# Patient Record
Sex: Female | Born: 1950 | Race: White | Hispanic: No | Smoking: Never smoker
Health system: Southern US, Community
[De-identification: ages and names within clinical notes are randomized; demographics above are authoritative.]

## PROBLEM LIST (undated history)

## (undated) DIAGNOSIS — I10 Essential (primary) hypertension: Secondary | ICD-10-CM

## (undated) DIAGNOSIS — D649 Anemia, unspecified: Secondary | ICD-10-CM

## (undated) DIAGNOSIS — F419 Anxiety disorder, unspecified: Secondary | ICD-10-CM

## (undated) DIAGNOSIS — E785 Hyperlipidemia, unspecified: Secondary | ICD-10-CM

## (undated) HISTORY — PX: ABDOMINAL HYSTERECTOMY: SHX81

---

## 2011-09-02 ENCOUNTER — Ambulatory Visit: Payer: Self-pay | Admitting: Internal Medicine

## 2017-10-10 ENCOUNTER — Encounter: Payer: Self-pay | Admitting: Emergency Medicine

## 2017-10-10 ENCOUNTER — Ambulatory Visit (INDEPENDENT_AMBULATORY_CARE_PROVIDER_SITE_OTHER): Payer: Medicare Other

## 2017-10-10 ENCOUNTER — Other Ambulatory Visit: Payer: Self-pay

## 2017-10-10 ENCOUNTER — Ambulatory Visit
Admission: EM | Admit: 2017-10-10 | Discharge: 2017-10-10 | Disposition: A | Discharge status: Home / Self Care | Payer: Medicare Other | Attending: Family Medicine | Admitting: Family Medicine

## 2017-10-10 DIAGNOSIS — S52502A Unspecified fracture of the lower end of left radius, initial encounter for closed fracture: Secondary | ICD-10-CM | POA: Diagnosis not present

## 2017-10-10 DIAGNOSIS — M25532 Pain in left wrist: Secondary | ICD-10-CM | POA: Diagnosis not present

## 2017-10-10 DIAGNOSIS — W19XXXA Unspecified fall, initial encounter: Secondary | ICD-10-CM

## 2017-10-10 HISTORY — DX: Anemia, unspecified: D64.9

## 2017-10-10 HISTORY — DX: Hyperlipidemia, unspecified: E78.5

## 2017-10-10 HISTORY — DX: Anxiety disorder, unspecified: F41.9

## 2017-10-10 HISTORY — DX: Essential (primary) hypertension: I10

## 2017-10-10 NOTE — ED Provider Notes (Signed)
[facility] ____________________________________________  Time seen: Approximately 0939 AM  I have reviewed the triage vital signs and the nursing notes.   HISTORY  Chief Complaint Wrist Injury (DOI 10/10/17)   HPI Kimberly Hendrix is a 67 y.o. female presented for evaluation of left wrist pain post injury that occurred earlier this morning prior to arrival.  Patient reports that her and her cat got outside, and in the process of getting the cat she tripped over some briers and fell.  States that she tried to catch herself with her left arm.  Denies other pain or injuries.  No head injury or loss conscious.  States continued pain to left wrist.  States did take over-the-counter ibuprofen just prior to arrival and states that that has helped with pain.  Denies paresthesias, loss of sensation, pain radiation or other discomfort.  States pain to left wrist currently is mild, worse with direct palpation or movement.  States range of motion limited to left wrist. Denies recent sickness. Denies recent antibiotic use.     Past Medical History:  Diagnosis Date  . Anemia   . Anxiety   . Hyperlipidemia   . Hypertension     There are no active problems to display for this patient.   Past Surgical History:  Procedure Laterality Date  . ABDOMINAL HYSTERECTOMY       No current facility-administered medications for this encounter.   Current Outpatient Medications:  .  amLODipine (NORVASC) 5 MG tablet, Take 5 mg by mouth daily., Disp: , Rfl:  .  aspirin EC 81 MG tablet, Take 81 mg by mouth daily., Disp: , Rfl:  .  atorvastatin (LIPITOR) 10 MG tablet, Take 10 mg by mouth daily., Disp: , Rfl:  .  benazepril-hydrochlorthiazide (LOTENSIN HCT) 20-12.5 MG tablet, Take 2 tablets by mouth daily., Disp: , Rfl:  .  buPROPion (WELLBUTRIN XL) 150 MG 24 hr tablet, Take 150 mg by mouth daily., Disp: , Rfl:  .  Cholecalciferol (VITAMIN D3) 2000 units TABS, Take 1 tablet by mouth daily., Disp: ,  Rfl:  .  diphenhydrAMINE (BENADRYL) 25 mg capsule, Take 25 mg by mouth at bedtime as needed., Disp: , Rfl:  .  ferrous sulfate 325 (65 FE) MG tablet, Take 325 mg by mouth daily with breakfast., Disp: , Rfl:  .  FLUoxetine (PROZAC) 20 MG tablet, Take 20 mg by mouth daily., Disp: , Rfl:  .  fluticasone (FLONASE) 50 MCG/ACT nasal spray, Place 1 spray into both nostrils daily., Disp: , Rfl:  .  ibuprofen (ADVIL,MOTRIN) 200 MG tablet, Take 800 mg by mouth daily., Disp: , Rfl:  .  metoprolol succinate (TOPROL-XL) 50 MG 24 hr tablet, Take 50 mg by mouth daily. Take with or immediately following a meal., Disp: , Rfl:  .  Multiple Vitamins-Minerals (CENTRUM SILVER 50+WOMEN PO), Take 1 tablet by mouth daily., Disp: , Rfl:  .  omeprazole (PRILOSEC) 40 MG capsule, Take 40 mg by mouth daily., Disp: , Rfl:   Allergies Penicillins  Family History  Problem Relation Age of Onset  . Breast cancer Mother 24       expired at age 46  . Hypertension Father   . Heart attack Father   . Diabetes Father     Social History Social History   Tobacco Use  . Smoking status: Never Smoker  . Smokeless tobacco: Never Used  Substance Use Topics  . Alcohol use: Yes    Alcohol/week: 1.2 oz    Types: 2 Glasses  of wine per week  . Drug use: No    Review of Systems Constitutional: No fever/chills Cardiovascular: Denies chest pain. Respiratory: Denies shortness of breath. Gastrointestinal: No abdominal pain. Musculoskeletal: Negative for back pain. As above.  Skin: Negative for rash.   ____________________________________________   PHYSICAL EXAM:  VITAL SIGNS: ED Triage Vitals  Enc Vitals Group     BP 10/10/17 0845 (!) 160/56     Pulse Rate 10/10/17 0845 60     Resp 10/10/17 0845 16     Temp 10/10/17 0845 98 F (36.7 C)     Temp Source 10/10/17 0845 Oral     SpO2 10/10/17 0845 98 %     Weight 10/10/17 0845 150 lb (68 kg)     Height 10/10/17 0845 5\' 6"  (1.676 m)     Head Circumference --       Peak Flow --      Pain Score 10/10/17 0846 7     Pain Loc --      Pain Edu? --      Excl. in GC? --     Constitutional: Alert and oriented. Well appearing and in no acute distress. ENT      Head: Normocephalic and atraumatic. Cardiovascular: Normal rate, regular rhythm. Grossly normal heart sounds.  Good peripheral circulation. Respiratory: Normal respiratory effort without tachypnea nor retractions. Breath sounds are clear and equal bilaterally. No wheezes, rales, rhonchi.. Musculoskeletal: Steady gait. Bilateral distal radial pulses equal and easily palpated.  Except: left distal radius moderate tenderness to palpation, positive deformity noted, mild localized swelling, no ecchymosis, wrist rotation limited, normal distal sensation and capillary refill to left hand, and left hand no motor or tendon deficits noted, left upper extremity otherwise nontender. Neurologic:  Normal speech and language. No gross focal neurologic deficits are appreciated. Speech is normal. No gait instability.  Skin:  Skin is warm, dry and intact. No rash noted. Psychiatric: Mood and affect are normal. Speech and behavior are normal. Patient exhibits appropriate insight and judgment   ___________________________________________   LABS (all labs ordered are listed, but only abnormal results are displayed)  Labs Reviewed - No data to display ____________________________________________  RADIOLOGY  Dg Wrist Complete Left  Result Date: 10/10/2017 CLINICAL DATA:  Larey SeatFell today.  Pain and deformity. EXAM: LEFT WRIST - COMPLETE 3+ VIEW COMPARISON:  None. FINDINGS: Transverse fracture of the distal radial metaphysis with dorsal displacement 4 mm and slight dorsal tilt of the distal radial articular surface. Fracture line does extend to the articular surface. Carpal bones move with the distal radial articular surface. No ulnar fracture or carpal fracture seen. IMPRESSION: Distal radial metaphyseal fracture with extension  to the joint surface. 4 mm dorsal displacement and slight dorsal tilt. Electronically Signed   By: Paulina FusiMark  Shogry M.D.   On: 10/10/2017 09:21   ____________________________________________   PROCEDURES Procedures    INITIAL IMPRESSION / ASSESSMENT AND PLAN / ED COURSE  Pertinent labs & imaging results that were available during my care of the patient were reviewed by me and considered in my medical decision making (see chart for details).  Well-appearing patient.  No acute distress.  Presented for evaluation of left wrist pain post mechanical injury that occurred prior to arrival.  Left wrist x-ray as above per radiologist, reviewed by myself.  Left x-ray distal radial modifiable fracture with extension to the joint surface and dorsal displacement and dorsal tilt noted.  OCL volar dorsal splint applied, sling given.  Directed to ice, elevate and  rest.  Patient denies need for prescription pain medication.  Recommend close orthopedic follow-up as concern may need surgical correction versus reduction.  Instructed to call today and have follow-up in 2-3 days.  Patient verbalized understanding, orthopedic information given.  Discussed follow up with Primary care physician this week as needed. Discussed follow up and return parameters including no resolution or any worsening concerns. Patient verbalized understanding and agreed to plan.   ____________________________________________   FINAL CLINICAL IMPRESSION(S) / ED DIAGNOSES  Final diagnoses:  Displaced fracture of distal end of left radius     ED Discharge Orders    None       Note: This dictation was prepared with Dragon dictation along with smaller phrase technology. Any transcriptional errors that result from this process are unintentional.         Renford Dills, NP 10/10/17 1032

## 2017-10-10 NOTE — Discharge Instructions (Signed)
Ice. Elevate. Keep in splint.   Follow up with orthopedic this week as discussed. See above to call today.   Follow up with your primary care physician this week as needed. Return to Urgent care for new or worsening concerns.

## 2017-10-10 NOTE — ED Triage Notes (Signed)
Patient in today c/o left wrist injury when she fell this morning.

## 2017-10-16 ENCOUNTER — Ambulatory Visit: Payer: Medicare Other

## 2017-10-16 ENCOUNTER — Ambulatory Visit: Payer: Medicare Other | Admitting: Anesthesiology

## 2017-10-16 ENCOUNTER — Ambulatory Visit
Admission: RE | Admit: 2017-10-16 | Discharge: 2017-10-16 | Disposition: A | Discharge status: Home / Self Care | Payer: Medicare Other | Source: Ambulatory Visit | Attending: Orthopedic Surgery | Admitting: Orthopedic Surgery

## 2017-10-16 ENCOUNTER — Other Ambulatory Visit: Payer: Self-pay

## 2017-10-16 ENCOUNTER — Encounter
Admission: RE | Disposition: A | Discharge status: Home / Self Care | Payer: Self-pay | Source: Ambulatory Visit | Attending: Orthopedic Surgery

## 2017-10-16 DIAGNOSIS — Z880 Allergy status to penicillin: Secondary | ICD-10-CM | POA: Diagnosis not present

## 2017-10-16 DIAGNOSIS — Y998 Other external cause status: Secondary | ICD-10-CM | POA: Diagnosis not present

## 2017-10-16 DIAGNOSIS — Z8249 Family history of ischemic heart disease and other diseases of the circulatory system: Secondary | ICD-10-CM | POA: Diagnosis not present

## 2017-10-16 DIAGNOSIS — I10 Essential (primary) hypertension: Secondary | ICD-10-CM | POA: Diagnosis not present

## 2017-10-16 DIAGNOSIS — M810 Age-related osteoporosis without current pathological fracture: Secondary | ICD-10-CM | POA: Diagnosis not present

## 2017-10-16 DIAGNOSIS — W010XXA Fall on same level from slipping, tripping and stumbling without subsequent striking against object, initial encounter: Secondary | ICD-10-CM | POA: Insufficient documentation

## 2017-10-16 DIAGNOSIS — S52552A Other extraarticular fracture of lower end of left radius, initial encounter for closed fracture: Secondary | ICD-10-CM | POA: Insufficient documentation

## 2017-10-16 DIAGNOSIS — K219 Gastro-esophageal reflux disease without esophagitis: Secondary | ICD-10-CM | POA: Insufficient documentation

## 2017-10-16 DIAGNOSIS — Z9889 Other specified postprocedural states: Secondary | ICD-10-CM

## 2017-10-16 DIAGNOSIS — F329 Major depressive disorder, single episode, unspecified: Secondary | ICD-10-CM | POA: Insufficient documentation

## 2017-10-16 DIAGNOSIS — Z79899 Other long term (current) drug therapy: Secondary | ICD-10-CM | POA: Diagnosis not present

## 2017-10-16 DIAGNOSIS — Y92009 Unspecified place in unspecified non-institutional (private) residence as the place of occurrence of the external cause: Secondary | ICD-10-CM | POA: Diagnosis not present

## 2017-10-16 DIAGNOSIS — Y93K9 Activity, other involving animal care: Secondary | ICD-10-CM | POA: Insufficient documentation

## 2017-10-16 DIAGNOSIS — Z7982 Long term (current) use of aspirin: Secondary | ICD-10-CM | POA: Insufficient documentation

## 2017-10-16 DIAGNOSIS — Z8781 Personal history of (healed) traumatic fracture: Secondary | ICD-10-CM

## 2017-10-16 HISTORY — PX: OPEN REDUCTION INTERNAL FIXATION (ORIF) DISTAL RADIAL FRACTURE: SHX5989

## 2017-10-16 SURGERY — OPEN REDUCTION INTERNAL FIXATION (ORIF) DISTAL RADIUS FRACTURE
Anesthesia: General | Site: Wrist | Laterality: Left | Wound class: Clean

## 2017-10-16 MED ORDER — FENTANYL CITRATE (PF) 100 MCG/2ML IJ SOLN
INTRAMUSCULAR | Status: DC | PRN
  Administered 2017-10-16 (×2): 50 ug via INTRAVENOUS

## 2017-10-16 MED ORDER — LACTATED RINGERS IV SOLN
INTRAVENOUS | Status: DC
  Administered 2017-10-16: 15:00:00 via INTRAVENOUS

## 2017-10-16 MED ORDER — ONDANSETRON HCL 4 MG/2ML IJ SOLN: 4.0000 mg | Freq: Once | INTRAMUSCULAR | Status: DC | PRN

## 2017-10-16 MED ORDER — ONDANSETRON HCL 4 MG/2ML IJ SOLN: 4.0000 mg | Freq: Four times a day (QID) | INTRAMUSCULAR | Status: DC | PRN

## 2017-10-16 MED ORDER — METOCLOPRAMIDE HCL 10 MG PO TABS: 5.0000 mg | ORAL_TABLET | Freq: Three times a day (TID) | ORAL | Status: DC | PRN

## 2017-10-16 MED ORDER — ONDANSETRON HCL 4 MG/2ML IJ SOLN
INTRAMUSCULAR | Status: AC
  Filled 2017-10-16: qty 2

## 2017-10-16 MED ORDER — DEXAMETHASONE SODIUM PHOSPHATE 10 MG/ML IJ SOLN
INTRAMUSCULAR | Status: DC | PRN
  Administered 2017-10-16: 10 mg via INTRAVENOUS

## 2017-10-16 MED ORDER — CLINDAMYCIN PHOSPHATE 900 MG/50ML IV SOLN
INTRAVENOUS | Status: AC
  Filled 2017-10-16: qty 50

## 2017-10-16 MED ORDER — CLINDAMYCIN PHOSPHATE 900 MG/50ML IV SOLN
900.0000 mg | Freq: Once | INTRAVENOUS | Status: AC
  Administered 2017-10-16: 900 mg via INTRAVENOUS

## 2017-10-16 MED ORDER — PROPOFOL 10 MG/ML IV BOLUS
INTRAVENOUS | Status: AC
  Filled 2017-10-16: qty 20

## 2017-10-16 MED ORDER — HYDROCODONE-ACETAMINOPHEN 5-325 MG PO TABS
ORAL_TABLET | ORAL | Status: AC
  Filled 2017-10-16: qty 1

## 2017-10-16 MED ORDER — PROPOFOL 10 MG/ML IV BOLUS
INTRAVENOUS | Status: DC | PRN
  Administered 2017-10-16: 200 mg via INTRAVENOUS

## 2017-10-16 MED ORDER — ACETAMINOPHEN 10 MG/ML IV SOLN
INTRAVENOUS | Status: AC
  Filled 2017-10-16: qty 100

## 2017-10-16 MED ORDER — HYDROCODONE-ACETAMINOPHEN 5-325 MG PO TABS
1.0000 | ORAL_TABLET | ORAL | Status: DC | PRN
  Administered 2017-10-16: 1 via ORAL

## 2017-10-16 MED ORDER — METOCLOPRAMIDE HCL 5 MG/ML IJ SOLN: 5.0000 mg | Freq: Three times a day (TID) | INTRAMUSCULAR | Status: DC | PRN

## 2017-10-16 MED ORDER — DEXAMETHASONE SODIUM PHOSPHATE 10 MG/ML IJ SOLN
INTRAMUSCULAR | Status: AC
  Filled 2017-10-16: qty 1

## 2017-10-16 MED ORDER — FENTANYL CITRATE (PF) 100 MCG/2ML IJ SOLN: 25.0000 ug | INTRAMUSCULAR | Status: DC | PRN

## 2017-10-16 MED ORDER — NEOMYCIN-POLYMYXIN B GU 40-200000 IR SOLN
Status: DC | PRN
  Administered 2017-10-16: 2 mL

## 2017-10-16 MED ORDER — ONDANSETRON HCL 4 MG/2ML IJ SOLN
INTRAMUSCULAR | Status: DC | PRN
  Administered 2017-10-16: 4 mg via INTRAVENOUS

## 2017-10-16 MED ORDER — SODIUM CHLORIDE 0.9 % IV SOLN: INTRAVENOUS | Status: DC

## 2017-10-16 MED ORDER — ONDANSETRON HCL 4 MG PO TABS: 4.0000 mg | ORAL_TABLET | Freq: Four times a day (QID) | ORAL | Status: DC | PRN

## 2017-10-16 MED ORDER — EPHEDRINE SULFATE 50 MG/ML IJ SOLN
INTRAMUSCULAR | Status: DC | PRN
  Administered 2017-10-16: 15 mg via INTRAVENOUS

## 2017-10-16 MED ORDER — FENTANYL CITRATE (PF) 100 MCG/2ML IJ SOLN
INTRAMUSCULAR | Status: AC
  Filled 2017-10-16: qty 2

## 2017-10-16 MED ORDER — MIDAZOLAM HCL 2 MG/2ML IJ SOLN
INTRAMUSCULAR | Status: AC
  Filled 2017-10-16: qty 2

## 2017-10-16 MED ORDER — MIDAZOLAM HCL 2 MG/2ML IJ SOLN
INTRAMUSCULAR | Status: DC | PRN
  Administered 2017-10-16: 2 mg via INTRAVENOUS

## 2017-10-16 MED ORDER — ACETAMINOPHEN 10 MG/ML IV SOLN
INTRAVENOUS | Status: DC | PRN
  Administered 2017-10-16: 1000 mg via INTRAVENOUS

## 2017-10-16 MED ORDER — NEOMYCIN-POLYMYXIN B GU 40-200000 IR SOLN
Status: AC
  Filled 2017-10-16: qty 2

## 2017-10-16 SURGICAL SUPPLY — 33 items
BANDAGE ACE 4X5 VEL STRL LF (GAUZE/BANDAGES/DRESSINGS) ×3 IMPLANT
CANISTER SUCT 1200ML W/VALVE (MISCELLANEOUS) ×3 IMPLANT
CHLORAPREP W/TINT 26ML (MISCELLANEOUS) ×3 IMPLANT
CUFF TOURN 18 STER (MISCELLANEOUS) ×3 IMPLANT
DRAPE FLUOR MINI C-ARM 54X84 (DRAPES) ×3 IMPLANT
ELECT REM PT RETURN 9FT ADLT (ELECTROSURGICAL) ×3
ELECTRODE REM PT RTRN 9FT ADLT (ELECTROSURGICAL) ×1 IMPLANT
GAUZE PETRO XEROFOAM 1X8 (MISCELLANEOUS) ×6 IMPLANT
GAUZE SPONGE 4X4 12PLY STRL (GAUZE/BANDAGES/DRESSINGS) ×3 IMPLANT
GLOVE SURG SYN 9.0  PF PI (GLOVE) ×2
GLOVE SURG SYN 9.0 PF PI (GLOVE) ×1 IMPLANT
GOWN SRG 2XL LVL 4 RGLN SLV (GOWNS) ×1 IMPLANT
GOWN STRL NON-REIN 2XL LVL4 (GOWNS) ×2
GOWN STRL REUS W/ TWL LRG LVL3 (GOWN DISPOSABLE) ×1 IMPLANT
GOWN STRL REUS W/TWL LRG LVL3 (GOWN DISPOSABLE) ×2
KIT RM TURNOVER STRD PROC AR (KITS) ×3 IMPLANT
NEEDLE FILTER BLUNT 18X 1/2SAF (NEEDLE) ×2
NEEDLE FILTER BLUNT 18X1 1/2 (NEEDLE) ×1 IMPLANT
NS IRRIG 500ML POUR BTL (IV SOLUTION) ×3 IMPLANT
PACK EXTREMITY ARMC (MISCELLANEOUS) ×3 IMPLANT
PAD CAST CTTN 4X4 STRL (SOFTGOODS) ×2 IMPLANT
PADDING CAST COTTON 4X4 STRL (SOFTGOODS) ×4
PEG SUBCHONDRAL SMOOTH 2.0X16 (Peg) ×3 IMPLANT
PEG SUBCHONDRAL SMOOTH 2.0X18 (Peg) ×3 IMPLANT
PEG SUBCHONDRAL SMOOTH 2.0X20 (Peg) ×12 IMPLANT
PLATE SHORT 21.6X48.9 NRRW LT (Plate) ×3 IMPLANT
SCREW CORT 3.5X10 LNG (Screw) ×9 IMPLANT
SPLINT CAST 1 STEP 3X12 (MISCELLANEOUS) ×3 IMPLANT
SUT ETHILON 4-0 (SUTURE) ×2
SUT ETHILON 4-0 FS2 18XMFL BLK (SUTURE) ×1
SUT VICRYL 3-0 27IN (SUTURE) ×3 IMPLANT
SUTURE ETHLN 4-0 FS2 18XMF BLK (SUTURE) ×1 IMPLANT
SYR 3ML LL SCALE MARK (SYRINGE) ×3 IMPLANT

## 2017-10-16 NOTE — Transfer of Care (Signed)
Immediate Anesthesia Transfer of Care Note  Patient: Kimberly Hendrix  Procedure(s) Performed: OPEN REDUCTION INTERNAL FIXATION (ORIF) DISTAL RADIAL FRACTURE (Left Wrist)  Patient Location: PACU  Anesthesia Type:General  Level of Consciousness: awake, alert  and oriented  Airway & Oxygen Therapy: Patient Spontanous Breathing and Patient connected to face mask oxygen  Post-op Assessment: Report given to RN and Post -op Vital signs reviewed and stable  Post vital signs: Reviewed and stable  Last Vitals:  Vitals:   10/16/17 1401 10/16/17 1657  BP: (!) 168/70 (!) 148/67  Pulse: 64 66  Resp: 16 15  Temp: 36.6 C 36.7 C  SpO2: 100% 100%    Last Pain:  Vitals:   10/16/17 1657  TempSrc:   PainSc: 2          Complications: No apparent anesthesia complications

## 2017-10-16 NOTE — Op Note (Signed)
10/16/2017  5:03 PM  PATIENT:  Kimberly Hendrix  67 y.o. female  PRE-OPERATIVE DIAGNOSIS:  OTHER CLOSED FRACTURE OF DISTAL END OF LEFT RADIUS extra-articular  POST-OPERATIVE DIAGNOSIS:  OTHER CLOSED FRACTURE OF DISTAL END OF LEFT RADIUS extra-articular  PROCEDURE:  Procedure(s): OPEN REDUCTION INTERNAL FIXATION (ORIF) DISTAL RADIAL FRACTURE (Left)  SURGEON: Leitha SchullerMichael J Shuntia Exton, MD  ASSISTANTS: None  ANESTHESIA:   general  EBL:  Total I/O In: 600 [I.V.:600] Out: 20 [Blood:20]  BLOOD ADMINISTERED:none  DRAINS: none   LOCAL MEDICATIONS USED:  NONE  SPECIMEN:  No Specimen  DISPOSITION OF SPECIMEN:  N/A  COUNTS:  YES  TOURNIQUET:  * Missing tourniquet times found for documented tourniquets in log: [id number] *18 minutes at 2 50 mmHg  IMPLANTS: Short narrow DVR plate with multiple screws and smooth pegs  DICTATION: .Dragon Dictation patient brought the operating room and after adequate anesthesia was obtained the left arm was prepped and draped in sterile fashion. After patient identification and timeout procedures were completed, tourniquet was raised. Incision was made over the FCR tendon sheath and the tendon sheath was incised with the tendon retracted radially. The deep tendon sheath was incised and the pronator exposed and elevated off the radial side of the distal radius and distal fragment. With traction applied off the end of the table was about 7 pounds of traction the fracture could be essentially anatomic reaming reduced and a short narrow DVR plate applied and pinned in position checking position on C-arm. Multiple smooth pegs were then placed into the distal fragment with drilling measuring off the drill guide and then placing the smooth pegs these have been filled and the K wire removed 310 mm cortical screws were placed in the shaft and this appeared to give near anatomic alignment with restoration of volar tilt length and radial inclination and the tourniquet is let down and the  wound thoroughly irrigated with closure with 3-0 Vicryl subcutaneously and skin 4-0 nylon for the skin. Xeroform 4 x 4's web roll and volar splint applied followed by an Ace wrap  PLAN OF CARE: Discharge to home after PACU  PATIENT DISPOSITION:  PACU - hemodynamically stable.

## 2017-10-16 NOTE — Discharge Instructions (Addendum)
AMBULATORY SURGERY  DISCHARGE INSTRUCTIONS   1) The drugs that you were given will stay in your system until tomorrow so for the next 24 hours you should not:  A) Drive an automobile B) Make any legal decisions C) Drink any alcoholic beverage   2) You may resume regular meals tomorrow.  Today it is better to start with liquids and gradually work up to solid foods.  You may eat anything you prefer, but it is better to start with liquids, then soup and crackers, and gradually work up to solid foods.   3) Please notify your doctor immediately if you have any unusual bleeding, trouble breathing, redness and pain at the surgery site, drainage, fever, or pain not relieved by medication. 4)   5) Your post-operative visit with Dr.                                     is: Date:                        Time:    Please call to schedule your post-operative visit.  6) Additional Instructions:    KEEP BANDAGE CLEAN AND DRY - DO NOT REMOVE ICE PACK AND ELEVATION

## 2017-10-16 NOTE — Anesthesia Preprocedure Evaluation (Addendum)
Anesthesia Evaluation  Patient identified by MRN, date of birth, ID band Patient awake    Reviewed: Allergy & Precautions, H&P , NPO status , Patient's Chart, lab work & pertinent test results, reviewed documented beta blocker date and time   History of Anesthesia Complications Negative for: history of anesthetic complications  Airway Mallampati: II  TM Distance: >3 FB Neck ROM: full    Dental   Pulmonary neg sleep apnea, neg COPD,    Pulmonary exam normal        Cardiovascular Exercise Tolerance: Good hypertension, On Medications and Pt. on home beta blockers (-) Past MI and (-) CHF negative cardio ROS Normal cardiovascular exam(-) dysrhythmias (-) Valvular Problems/Murmurs Rate:Normal     Neuro/Psych neg Seizures Anxiety negative psych ROS   GI/Hepatic Neg liver ROS, neg GERD  ,  Endo/Other  neg diabetes  Renal/GU negative Renal ROS  negative genitourinary   Musculoskeletal   Abdominal   Peds  Hematology  (+) anemia ,   Anesthesia Other Findings   Reproductive/Obstetrics negative OB ROS                            Anesthesia Physical Anesthesia Plan  ASA: II  Anesthesia Plan: General LMA and General   Post-op Pain Management:    Induction: Intravenous  PONV Risk Score and Plan: 3 and Ondansetron, Dexamethasone, Midazolam and Treatment may vary due to age or medical condition  Airway Management Planned: LMA  Additional Equipment:   Intra-op Plan:   Post-operative Plan:   Informed Consent: I have reviewed the patients History and Physical, chart, labs and discussed the procedure including the risks, benefits and alternatives for the proposed anesthesia with the patient or authorized representative who has indicated his/her understanding and acceptance.     Plan Discussed with: CRNA  Anesthesia Plan Comments:        Anesthesia Quick Evaluation

## 2017-10-16 NOTE — Anesthesia Postprocedure Evaluation (Signed)
Anesthesia Post Note  Patient: Kimberly Hendrix  Procedure(s) Performed: OPEN REDUCTION INTERNAL FIXATION (ORIF) DISTAL RADIAL FRACTURE (Left Wrist)  Patient location during evaluation: PACU Anesthesia Type: General Level of consciousness: awake and alert Pain management: pain level controlled Vital Signs Assessment: post-procedure vital signs reviewed and stable Respiratory status: spontaneous breathing and respiratory function stable Cardiovascular status: stable Anesthetic complications: no     Last Vitals:  Vitals:   10/16/17 1657 10/16/17 1712  BP: (!) 148/67 (!) 153/73  Pulse: 66 69  Resp: 15 11  Temp: 36.7 C   SpO2: 100% 100%    Last Pain:  Vitals:   10/16/17 1657  TempSrc:   PainSc: 2                  Argelia Formisano K

## 2017-10-16 NOTE — OR Nursing (Signed)
Pt advised postop she didn't want rx for pain med but will call MD if she needs later.

## 2017-10-16 NOTE — H&P (Signed)
Reviewed paper H+P, will be scanned into chart. No changes noted.  

## 2017-10-16 NOTE — H&P (Signed)
Kimberly Hendrix, [name] - 10/12/2017 11:15 AM EST Formatting of this note might be different from the original. Chief Complaint: Chief Complaint  Patient presents with  . Wrist Pain  Left wrist, had injury 1.9.19   Kimberly Hendrix is a 67 y.o. female who presents today for evaluation of a left wrist injury sustained on 10/10/17. The patient was evaluated at [facility] after she had to chase after her cat who got out of her house and she tripped and fell landing directly onto her left wrist. She believes that she did try to catch herself, she noticed immediate pain upon landing on her wrist and noticed swelling. She is right-hand dominant but does use her left hand for activities during the day, she is extremely active. X-rays at the urgent care demonstrated a left distal radius fracture with dorsal displacement to 4 mm with dorsal tilt as well. Fracture line does not appear to extend to the articular surface. She was placed into a short arm splint and instructed to follow-up with orthopedics. She reports pain in the left wrist is a 2 out of 10 at today's visit. She denies any numbness or tingling to left upper extremity. She denies any previous surgical history to the left wrist. No personal history of heart attack, stroke, asthma or COPD. No history of blood clots. The patient does take a daily 81 mg aspirin.  Past Medical History: Past Medical History:  Diagnosis Date  . Allergic state  ALLERGIC RHINITIS  . Atrophic vaginitis  . Depression, unspecified  . GERD (gastroesophageal reflux disease)  . Hypertension  . Osteoporosis, post-menopausal  . Ovarian failure   Past Surgical History: Past Surgical History:  Procedure Laterality Date  . COLONOSCOPY 2006  . HYSTERECTOMY   Past Family History: Family History  Problem Relation Age of Onset  . Breast cancer Mother  . Myocardial Infarction (Heart attack) Father  . High blood pressure (Hypertension) Father    Medications: Current Outpatient Medications Ordered in Epic  Medication Sig Dispense Refill  . amLODIPine (NORVASC) 5 MG tablet Take 1 tablet (5 mg total) by mouth once daily. 90 tablet 3  . aspirin 81 mg tablet 1 tab(s) By Mouth Daily  . atorvastatin (LIPITOR) 10 MG tablet Take 1 tablet (10 mg total) by mouth once daily. 90 tablet 3  . benazepril-hydrochlorthiazide (LOTENSIN HCT) 20-12.5 mg tablet Take 2 tablets by mouth once daily. 180 tablet 3  . buPROPion (WELLBUTRIN XL) 150 MG XL tablet Take 1 tablet (150 mg total) by mouth once daily. 90 tablet 3  . cholecalciferol (VITAMIN D3) 2,000 unit capsule Take 2,000 Units by mouth daily.  . diphenhydrAMINE (BENADRYL) 25 mg capsule 1 cap(s) By Mouth At Bedtime as needed  . ferrous sulfate 325 (65 FE) MG tablet Take 325 mg by mouth daily with breakfast.  . FLUoxetine (PROZAC) 20 MG capsule Take 1 capsule (20 mg total) by mouth once daily. 90 capsule 3  . fluticasone (FLONASE) 50 mcg/actuation nasal spray Place 1 spray into both nostrils once daily 48 g 3  . ibuprofen 200 mg Cap 1 cap(s) By Mouth Daily  . metoprolol succinate (TOPROL-XL) 50 MG XL tablet Take 1 tablet (50 mg total) by mouth once daily. 90 tablet 3  . multivitamin-minerals-lutein (CENTRUM SILVER) Tab 1 tab(s) By Mouth Daily  . omega-3 fatty acids-fish oil (THERAGRAN-M) 360-1,200 mg Cap 1 cap(s) By Mouth Daily  . omeprazole (PRILOSEC) 40 MG DR capsule Take 1 capsule (40 mg total) by mouth  once daily. 90 capsule 3   No current Epic-ordered facility-administered medications on file.   Allergies: Allergies  Allergen Reactions  . Penicillins Unknown    Review of Systems:  A comprehensive 14 point ROS was performed, reviewed by me today, and the pertinent orthopaedic findings are documented in the HPI.  Exam: Ht 167.6 cm (5\' 6" )  Wt 68 kg (150 lb)  BMI 24.21 kg/m  General/Constitutional: The patient appears to be well-nourished, well-developed, and in no acute  distress. Neuro/Psych: Normal mood and affect, oriented to person, place and time. Eyes: Non-icteric. Pupils are equal, round, and reactive to light, and exhibit synchronous movement. ENT: Unremarkable. Lymphatic: No palpable adenopathy. Respiratory: Lungs clear to auscultation, Normal chest excursion, No wheezes and Non-labored breathing Cardiovascular: Regular rate and rhythm. No murmurs. and No edema, swelling or tenderness, except as noted in detailed exam. Integumentary: No impressive skin lesions present, except as noted in detailed exam. Musculoskeletal: Unremarkable, except as noted in detailed exam.  Skin examination left wrist reveals a well-padded short arm wrist splint. The wrist is in slight flexed position due to the dorsal displacement. The patient is able to flex and extend her fingers, the splint does appear to extend far on the left thumb so the splint was debrided to allow increased flexion extension exercises to be performed. She does have pain with attempted supination pronation left wrist. Patient is intact light touch along the dorsal volar aspect of her fingers in addition to all the proximal aspect of the splint. Cap refill intact to each individual finger.  Imaging: [facility] are detailed in HPI above.  Impression: Other closed fracture of distal end of left radius, initial encounter [S52.592A] Other closed fracture of distal end of left radius, initial encounter (primary encounter diagnosis)  Plan:  1. Treatment options were discussed today with the patient. 2. Spoke with the patient about both nonsurgical as well as surgical treatment options. Due to her activity level and degree of displacement I discussed with her ORIF of left distal radius fracture and the risk and benefits of surgery. The patient would like to proceed with surgical intervention. 3. The patient will be scheduled for a left with Dr. Rosita KeaMenz on 10/16/17. This document  will serve as a surgical history and physical for the patient. 4. The patient will follow-up in 3 days following surgery for routine skin check and placement into a Velcro wrist splint. They can call the clinic they have any questions, new symptoms develop or symptoms worsen.  The procedure was discussed with the patient, as were the potential risks (including bleeding, infection, nerve and/or blood vessel injury, persistent or recurrent pain, failure of the repair, progression of arthritis, need for further surgery, blood clots, strokes, heart attacks and/or arhythmias, pneumonia, etc.) and benefits. The patient states her understanding and wishes to proceed.  Review of the [place] CSRS was performed in accordance of the [facility] prior to dispensing any controlled drugs.  This note was generated in part with voice recognition software and I apologize for any typographical errors that were not detected and corrected.  Valeria BatmanJ. Lance McGhee, PA-C [facility]

## 2017-10-16 NOTE — Anesthesia Procedure Notes (Signed)
Procedure Name: LMA Insertion Date/Time: 10/16/2017 4:11 PM Performed by: Omer JackWeatherly, Mellisa Arshad, CRNA Pre-anesthesia Checklist: Patient identified, Patient being monitored, Timeout performed, Emergency Drugs available and Suction available Patient Re-evaluated:Patient Re-evaluated prior to induction Oxygen Delivery Method: Circle system utilized Preoxygenation: Pre-oxygenation with 100% oxygen Induction Type: IV induction Ventilation: Mask ventilation without difficulty LMA: LMA inserted LMA Size: 3.5 Tube type: Oral Number of attempts: 1 Placement Confirmation: positive ETCO2 and breath sounds checked- equal and bilateral Tube secured with: Tape Dental Injury: Teeth and Oropharynx as per pre-operative assessment

## 2017-10-16 NOTE — Anesthesia Post-op Follow-up Note (Signed)
Anesthesia QCDR form completed.        

## 2017-10-17 ENCOUNTER — Encounter: Payer: Self-pay | Admitting: Orthopedic Surgery

## 2019-09-02 IMAGING — CR DG WRIST COMPLETE 3+V*L*
4 series · 4 of 4 positions shown · non-contrast
Comparison: None.

CLINICAL DATA: Fell today.  Pain and deformity.

EXAM:
LEFT WRIST - COMPLETE 3+ VIEW

[wrist pa]
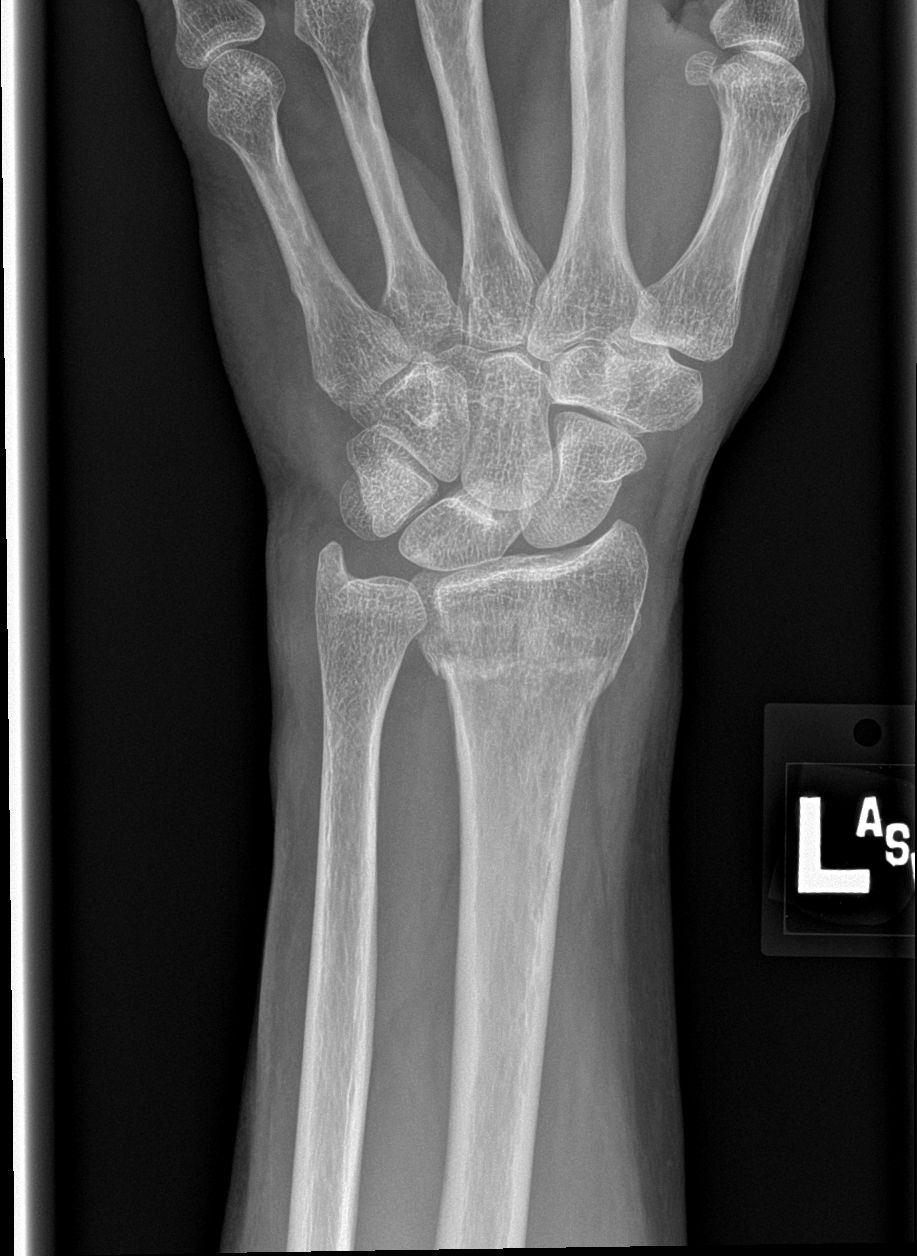

[wrist lat]
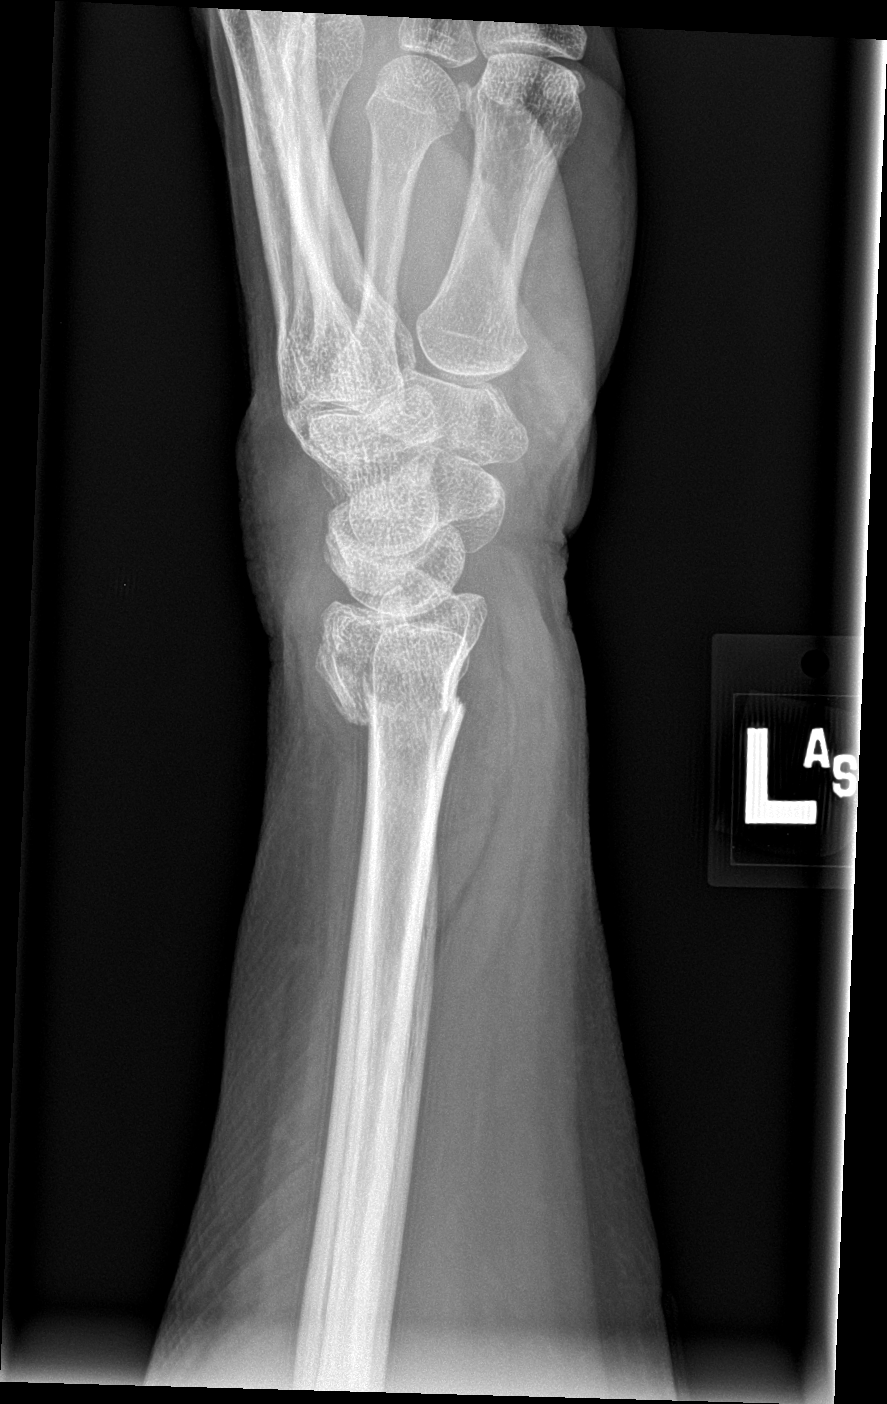

[wrist navicular]
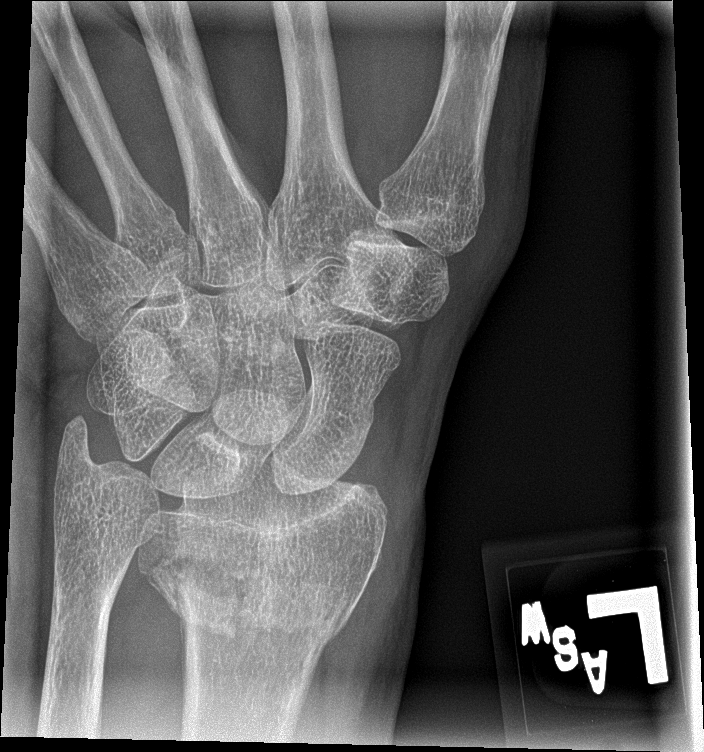

[wrist obl]
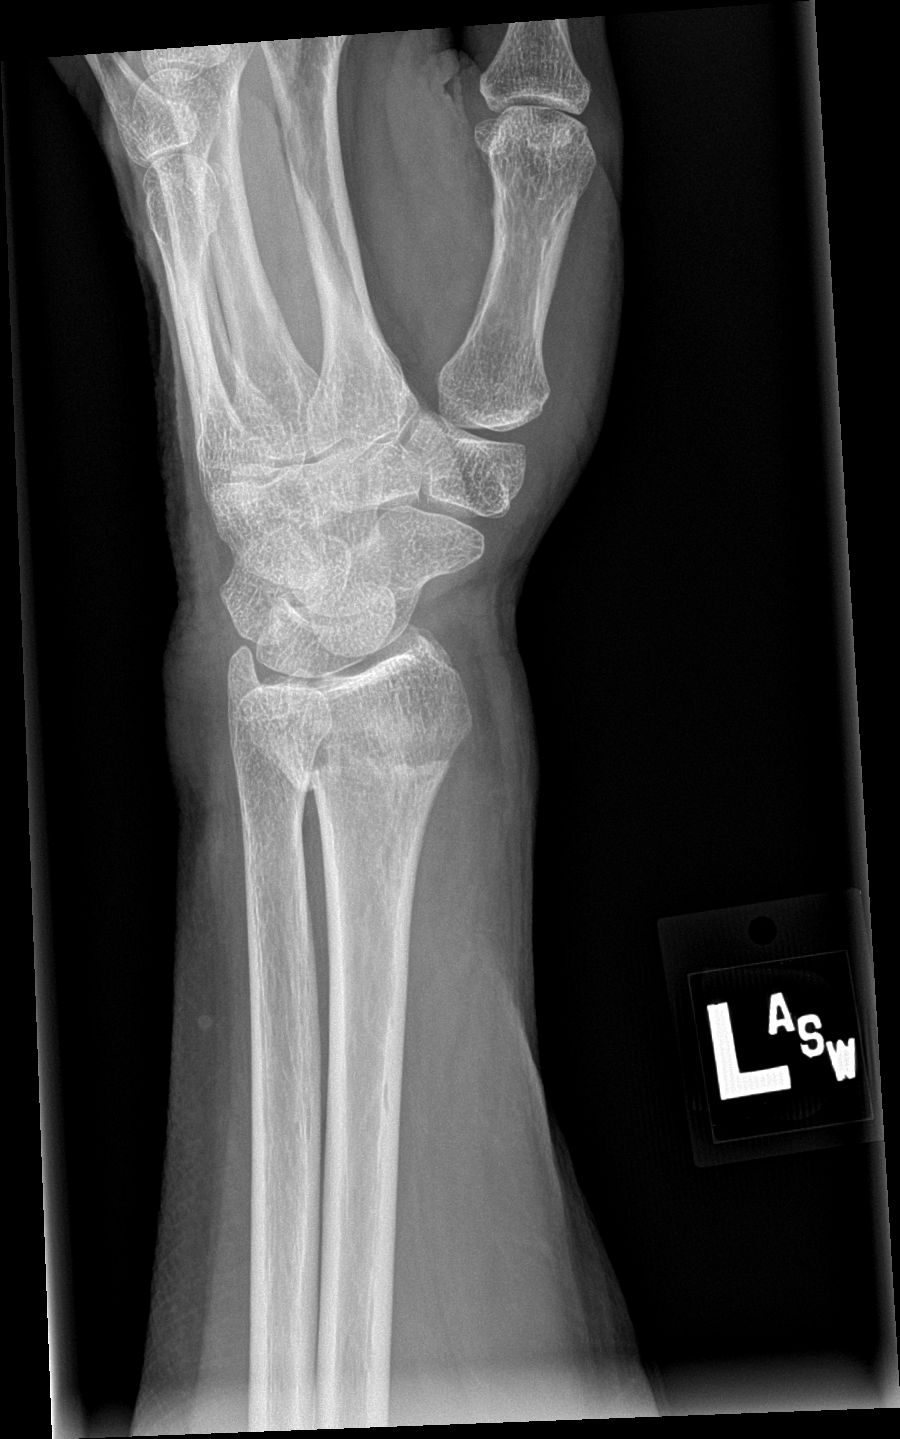

[4 of 4 positions shown; findings below may reference images not displayed]

FINDINGS: Transverse fracture of the distal radial metaphysis with dorsal
displacement 4 mm and slight dorsal tilt of the distal radial
articular surface. Fracture line does extend to the articular
surface. Carpal bones move with the distal radial articular surface.
No ulnar fracture or carpal fracture seen.
IMPRESSION: Distal radial metaphyseal fracture with extension to the joint
surface. 4 mm dorsal displacement and slight dorsal tilt.

## 2019-09-08 IMAGING — DX DG WRIST 2V*L*
2 series · 2 of 2 positions shown · non-contrast
Comparison: 10/10/2017 left wrist radiographs

CLINICAL DATA: Status post ORIF left wrist fracture

EXAM:
LEFT WRIST - 2 VIEW

[wrist ap]
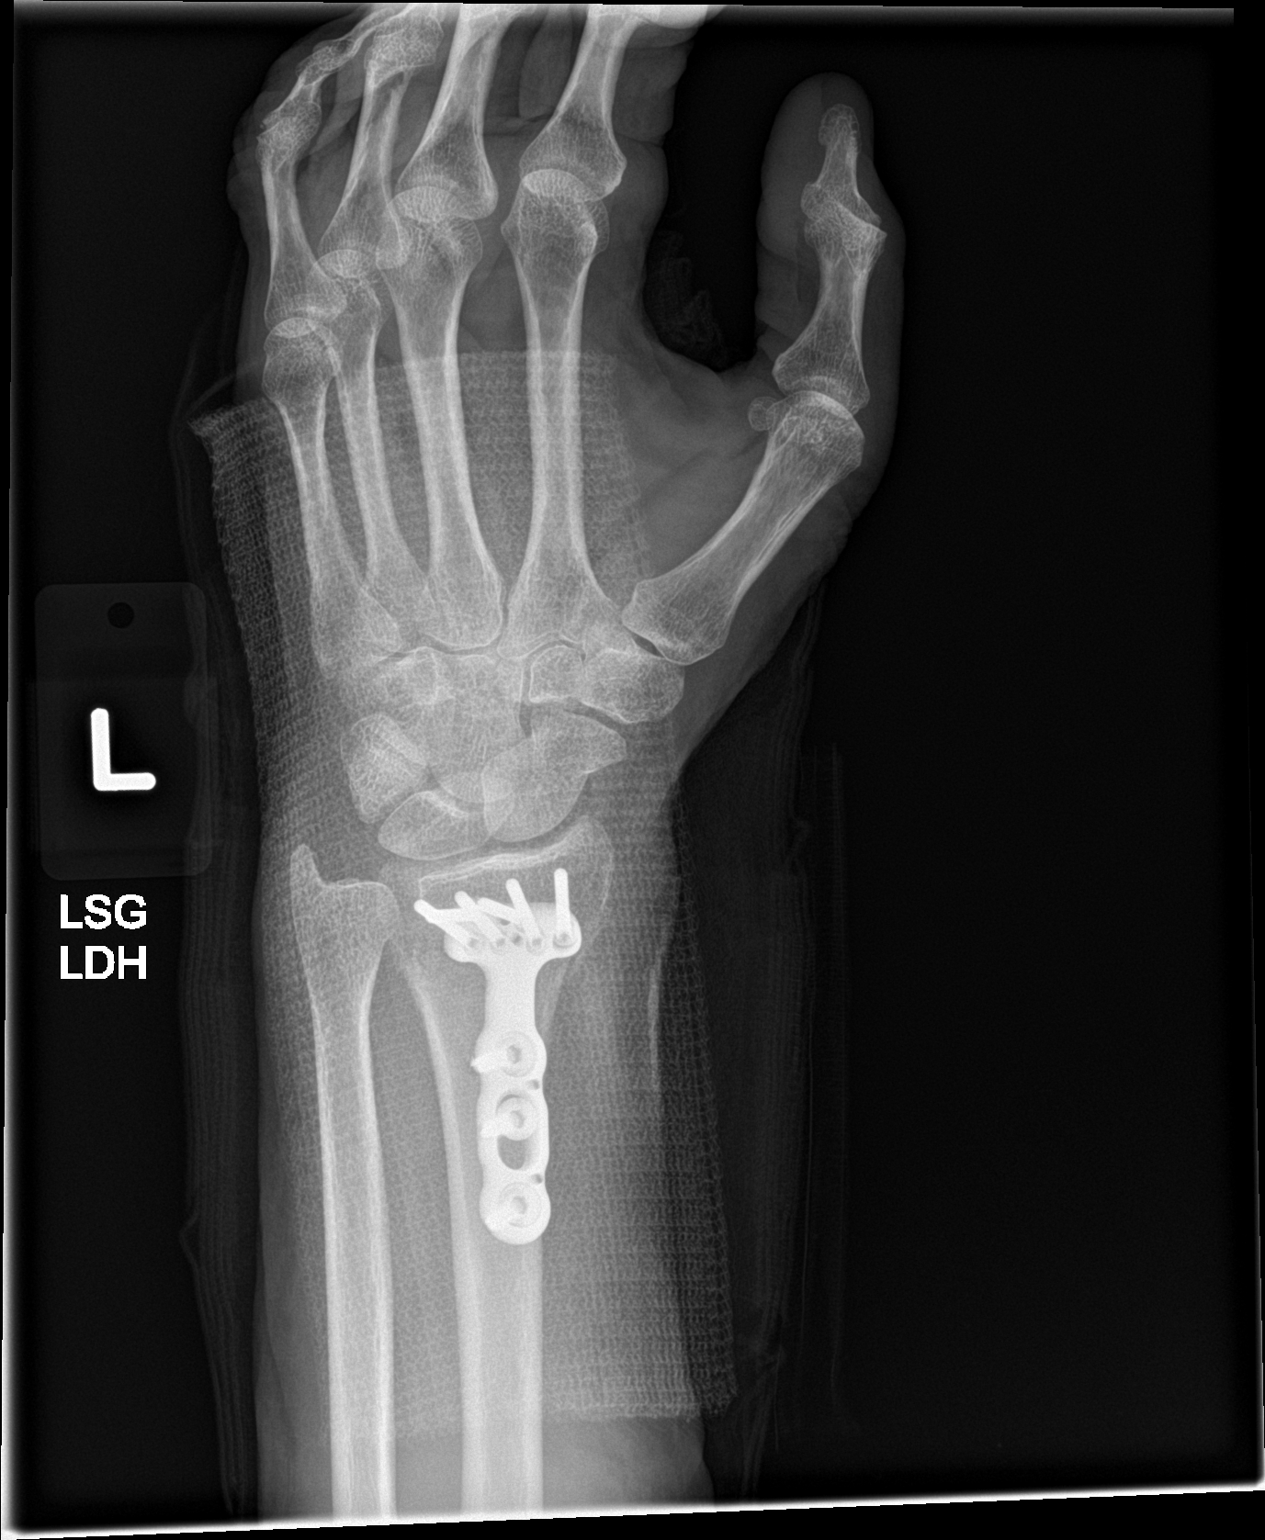

[wrist lat]
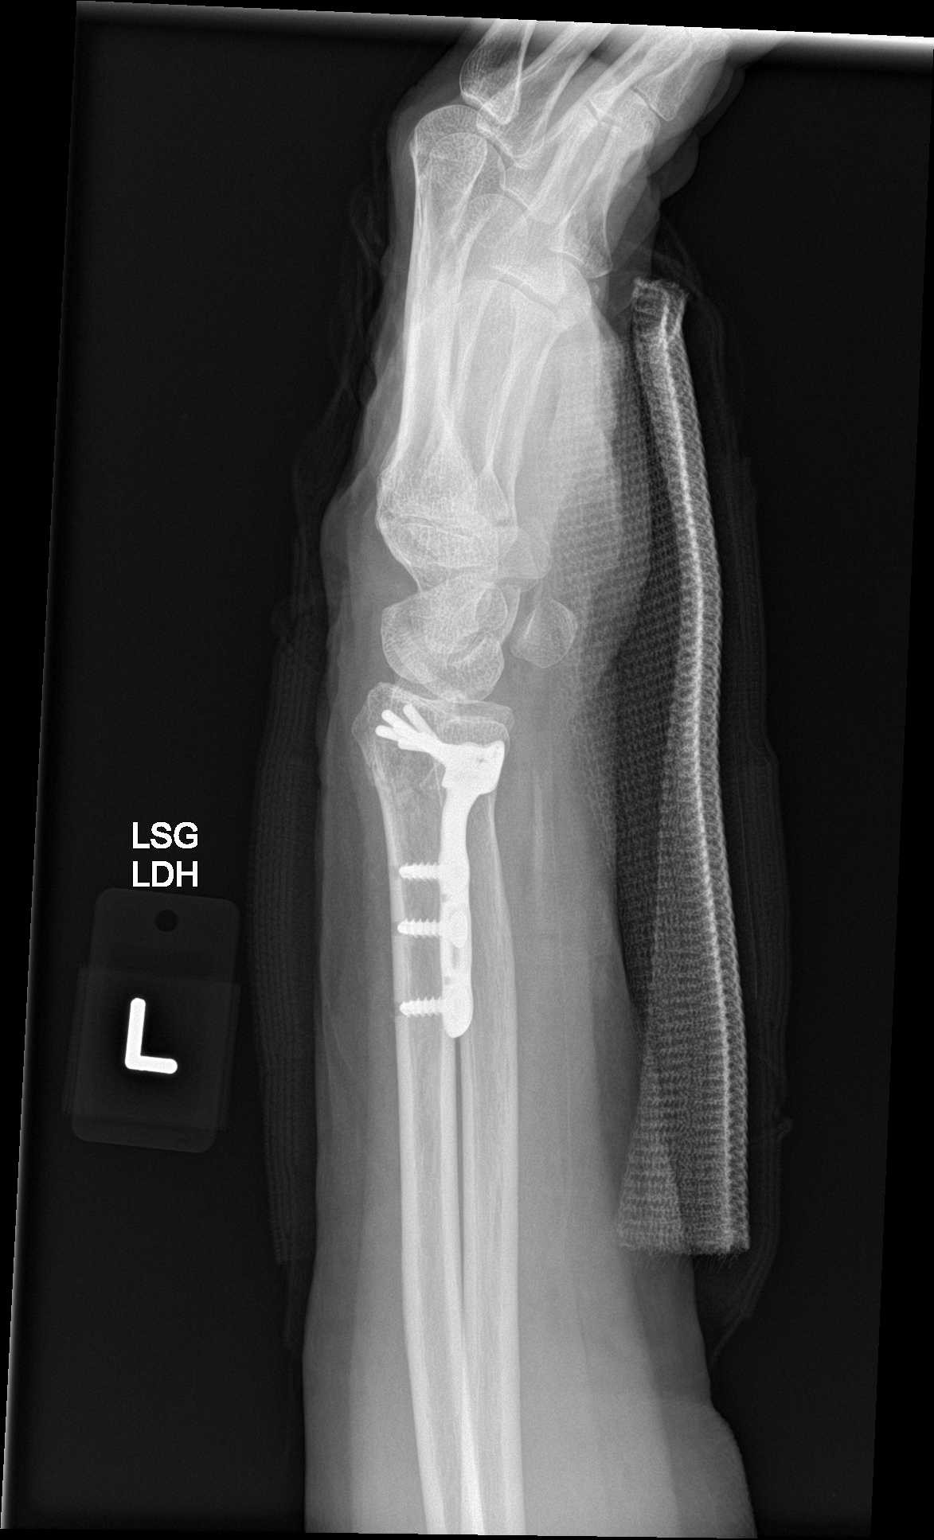

[2 of 2 positions shown; findings below may reference images not displayed]

FINDINGS: Overlying cast obscures fine bone detail. Status post volar
transfixation of comminuted left distal radius fracture in
near-anatomic alignment with surgical plate and interlocking screws.
No additional fracture. No dislocation. No suspicious focal osseous
lesion. No significant arthropathy.
IMPRESSION: Near-anatomic alignment of comminuted distal left radius fracture
status post ORIF.

## 2022-10-03 ENCOUNTER — Emergency Department: Payer: Medicare Other

## 2022-10-03 ENCOUNTER — Other Ambulatory Visit: Payer: Self-pay

## 2022-10-03 ENCOUNTER — Ambulatory Visit
Admission: EM | Admit: 2022-10-03 | Discharge: 2022-10-03 | Disposition: A | Discharge status: Home / Self Care | Payer: Medicare Other | Source: Home / Self Care

## 2022-10-03 ENCOUNTER — Encounter: Payer: Self-pay | Admitting: Emergency Medicine

## 2022-10-03 ENCOUNTER — Inpatient Hospital Stay
Admission: EM | Admit: 2022-10-03 | Discharge: 2022-10-07 | DRG: 193 | Disposition: A | Discharge status: Home / Self Care | Payer: Medicare Other | Attending: Internal Medicine | Admitting: Internal Medicine

## 2022-10-03 DIAGNOSIS — E871 Hypo-osmolality and hyponatremia: Secondary | ICD-10-CM | POA: Diagnosis present

## 2022-10-03 DIAGNOSIS — R051 Acute cough: Secondary | ICD-10-CM | POA: Insufficient documentation

## 2022-10-03 DIAGNOSIS — Z880 Allergy status to penicillin: Secondary | ICD-10-CM | POA: Diagnosis not present

## 2022-10-03 DIAGNOSIS — F32A Depression, unspecified: Secondary | ICD-10-CM | POA: Insufficient documentation

## 2022-10-03 DIAGNOSIS — J1000 Influenza due to other identified influenza virus with unspecified type of pneumonia: Secondary | ICD-10-CM | POA: Diagnosis present

## 2022-10-03 DIAGNOSIS — K219 Gastro-esophageal reflux disease without esophagitis: Secondary | ICD-10-CM | POA: Insufficient documentation

## 2022-10-03 DIAGNOSIS — J1100 Influenza due to unidentified influenza virus with unspecified type of pneumonia: Secondary | ICD-10-CM | POA: Diagnosis present

## 2022-10-03 DIAGNOSIS — R000 Tachycardia, unspecified: Secondary | ICD-10-CM | POA: Diagnosis present

## 2022-10-03 DIAGNOSIS — Z803 Family history of malignant neoplasm of breast: Secondary | ICD-10-CM

## 2022-10-03 DIAGNOSIS — Z833 Family history of diabetes mellitus: Secondary | ICD-10-CM

## 2022-10-03 DIAGNOSIS — D696 Thrombocytopenia, unspecified: Secondary | ICD-10-CM | POA: Diagnosis present

## 2022-10-03 DIAGNOSIS — Z7982 Long term (current) use of aspirin: Secondary | ICD-10-CM

## 2022-10-03 DIAGNOSIS — E876 Hypokalemia: Secondary | ICD-10-CM | POA: Diagnosis present

## 2022-10-03 DIAGNOSIS — F419 Anxiety disorder, unspecified: Secondary | ICD-10-CM | POA: Diagnosis present

## 2022-10-03 DIAGNOSIS — J189 Pneumonia, unspecified organism: Secondary | ICD-10-CM

## 2022-10-03 DIAGNOSIS — I10 Essential (primary) hypertension: Secondary | ICD-10-CM | POA: Diagnosis present

## 2022-10-03 DIAGNOSIS — E785 Hyperlipidemia, unspecified: Secondary | ICD-10-CM | POA: Insufficient documentation

## 2022-10-03 DIAGNOSIS — Z20828 Contact with and (suspected) exposure to other viral communicable diseases: Secondary | ICD-10-CM

## 2022-10-03 DIAGNOSIS — Z79899 Other long term (current) drug therapy: Secondary | ICD-10-CM

## 2022-10-03 DIAGNOSIS — I4891 Unspecified atrial fibrillation: Secondary | ICD-10-CM | POA: Diagnosis not present

## 2022-10-03 DIAGNOSIS — J9601 Acute respiratory failure with hypoxia: Secondary | ICD-10-CM

## 2022-10-03 DIAGNOSIS — I480 Paroxysmal atrial fibrillation: Secondary | ICD-10-CM | POA: Diagnosis not present

## 2022-10-03 DIAGNOSIS — Z8249 Family history of ischemic heart disease and other diseases of the circulatory system: Secondary | ICD-10-CM | POA: Diagnosis not present

## 2022-10-03 DIAGNOSIS — E878 Other disorders of electrolyte and fluid balance, not elsewhere classified: Secondary | ICD-10-CM | POA: Diagnosis present

## 2022-10-03 DIAGNOSIS — Z1152 Encounter for screening for COVID-19: Secondary | ICD-10-CM | POA: Insufficient documentation

## 2022-10-03 DIAGNOSIS — I160 Hypertensive urgency: Secondary | ICD-10-CM

## 2022-10-03 DIAGNOSIS — J101 Influenza due to other identified influenza virus with other respiratory manifestations: Principal | ICD-10-CM

## 2022-10-03 LAB — BASIC METABOLIC PANEL
Anion gap: 13 (ref 5–15)
BUN: 12 mg/dL (ref 8–23)
CO2: 30 mmol/L (ref 22–32)
Calcium: 8.4 mg/dL — ABNORMAL LOW (ref 8.9–10.3)
Chloride: 87 mmol/L — ABNORMAL LOW (ref 98–111)
Creatinine, Ser: 0.51 mg/dL (ref 0.44–1.00)
GFR, Estimated: >60 mL/min (ref >60)
Glucose, Bld: 111 mg/dL — ABNORMAL HIGH (ref 70–99)
Potassium: 2.7 mmol/L — CL (ref 3.5–5.1)
Sodium: 130 mmol/L — ABNORMAL LOW (ref 135–145)

## 2022-10-03 LAB — BRAIN NATRIURETIC PEPTIDE: B Natriuretic Peptide: 324 pg/mL — ABNORMAL HIGH (ref 0.0–100.0)

## 2022-10-03 LAB — CBC WITH DIFFERENTIAL/PLATELET
Abs Immature Granulocytes: 0.02 10*3/uL (ref 0.00–0.07)
Basophils Absolute: 0 10*3/uL (ref 0.0–0.1)
Basophils Relative: 0 %
Eosinophils Absolute: 0 10*3/uL (ref 0.0–0.5)
Eosinophils Relative: 0 %
HCT: 34.8 % — ABNORMAL LOW (ref 36.0–46.0)
Hemoglobin: 12 g/dL (ref 12.0–15.0)
Immature Granulocytes: 0 %
Lymphocytes Relative: 12 %
Lymphs Abs: 0.6 10*3/uL — ABNORMAL LOW (ref 0.7–4.0)
MCH: 28.2 pg (ref 26.0–34.0)
MCHC: 34.5 g/dL (ref 30.0–36.0)
MCV: 81.7 fL (ref 80.0–100.0)
Monocytes Absolute: 0.3 10*3/uL (ref 0.1–1.0)
Monocytes Relative: 6 %
Neutro Abs: 4.5 10*3/uL (ref 1.7–7.7)
Neutrophils Relative %: 82 %
Platelets: 149 10*3/uL — ABNORMAL LOW (ref 150–400)
RBC: 4.26 MIL/uL (ref 3.87–5.11)
RDW: 14.4 % (ref 11.5–15.5)
Smear Review: NORMAL
WBC: 5.5 10*3/uL (ref 4.0–10.5)
nRBC: 0 % (ref 0.0–0.2)

## 2022-10-03 LAB — RESP PANEL BY RT-PCR (RSV, FLU A&B, COVID)  RVPGX2
Influenza A by PCR: POSITIVE — AB
Influenza A by PCR: POSITIVE — AB
Influenza B by PCR: NEGATIVE
Influenza B by PCR: NEGATIVE
Resp Syncytial Virus by PCR: NEGATIVE
Resp Syncytial Virus by PCR: NEGATIVE
SARS Coronavirus 2 by RT PCR: NEGATIVE
SARS Coronavirus 2 by RT PCR: NEGATIVE

## 2022-10-03 LAB — MAGNESIUM: Magnesium: 1.6 mg/dL — ABNORMAL LOW (ref 1.7–2.4)

## 2022-10-03 LAB — PROCALCITONIN: Procalcitonin: 0.21 ng/mL

## 2022-10-03 MED ORDER — POTASSIUM CHLORIDE 10 MEQ/100ML IV SOLN
10.0000 meq | INTRAVENOUS | Status: AC
  Administered 2022-10-03 (×2): 10 meq via INTRAVENOUS
  Filled 2022-10-03 (×2): qty 100

## 2022-10-03 MED ORDER — POTASSIUM CHLORIDE CRYS ER 20 MEQ PO TBCR
40.0000 meq | EXTENDED_RELEASE_TABLET | Freq: Once | ORAL | Status: AC
  Administered 2022-10-03: 40 meq via ORAL
  Filled 2022-10-03: qty 2

## 2022-10-03 MED ORDER — SODIUM CHLORIDE 0.9 % IV SOLN: 500.0000 mg | INTRAVENOUS | Status: DC

## 2022-10-03 MED ORDER — MAGNESIUM HYDROXIDE 400 MG/5ML PO SUSP: 30.0000 mL | Freq: Every day | ORAL | Status: DC | PRN

## 2022-10-03 MED ORDER — SODIUM CHLORIDE 0.9 % IV BOLUS
1000.0000 mL | Freq: Once | INTRAVENOUS | Status: AC
  Administered 2022-10-03: 1000 mL via INTRAVENOUS

## 2022-10-03 MED ORDER — SODIUM CHLORIDE 0.9 % IV SOLN
2.0000 g | INTRAVENOUS | Status: DC
  Administered 2022-10-04 – 2022-10-05 (×2): 2 g via INTRAVENOUS
  Filled 2022-10-03 (×2): qty 20

## 2022-10-03 MED ORDER — VITAMIN D 25 MCG (1000 UNIT) PO TABS
2000.0000 [IU] | ORAL_TABLET | Freq: Every day | ORAL | Status: DC
  Administered 2022-10-04 – 2022-10-07 (×4): 2000 [IU] via ORAL
  Filled 2022-10-03 (×4): qty 2

## 2022-10-03 MED ORDER — IPRATROPIUM-ALBUTEROL 0.5-2.5 (3) MG/3ML IN SOLN
3.0000 mL | Freq: Four times a day (QID) | RESPIRATORY_TRACT | Status: DC
  Administered 2022-10-04: 3 mL via RESPIRATORY_TRACT
  Filled 2022-10-03: qty 3

## 2022-10-03 MED ORDER — SODIUM CHLORIDE 0.9 % IV SOLN
500.0000 mg | Freq: Once | INTRAVENOUS | Status: AC
  Administered 2022-10-03: 500 mg via INTRAVENOUS
  Filled 2022-10-03: qty 5

## 2022-10-03 MED ORDER — ADULT MULTIVITAMIN W/MINERALS CH
1.0000 | ORAL_TABLET | Freq: Every day | ORAL | Status: DC
  Administered 2022-10-04 – 2022-10-07 (×4): 1 via ORAL
  Filled 2022-10-03 (×4): qty 1

## 2022-10-03 MED ORDER — BENAZEPRIL-HYDROCHLOROTHIAZIDE 20-12.5 MG PO TABS: 2.0000 | ORAL_TABLET | Freq: Every day | ORAL | Status: DC

## 2022-10-03 MED ORDER — ONDANSETRON HCL 4 MG PO TABS: 4.0000 mg | ORAL_TABLET | Freq: Four times a day (QID) | ORAL | Status: DC | PRN

## 2022-10-03 MED ORDER — OLMESARTAN MEDOXOMIL-HCTZ 40-25 MG PO TABS: 1.0000 | ORAL_TABLET | Freq: Every day | ORAL | Status: DC

## 2022-10-03 MED ORDER — POTASSIUM CHLORIDE 20 MEQ PO PACK
40.0000 meq | PACK | Freq: Once | ORAL | Status: AC
  Administered 2022-10-04: 40 meq via ORAL
  Filled 2022-10-03: qty 2

## 2022-10-03 MED ORDER — ONDANSETRON HCL 4 MG/2ML IJ SOLN: 4.0000 mg | Freq: Four times a day (QID) | INTRAMUSCULAR | Status: DC | PRN

## 2022-10-03 MED ORDER — FLUOXETINE HCL 20 MG PO CAPS
40.0000 mg | ORAL_CAPSULE | Freq: Every day | ORAL | Status: DC
  Administered 2022-10-04 – 2022-10-07 (×4): 40 mg via ORAL
  Filled 2022-10-03 (×4): qty 2

## 2022-10-03 MED ORDER — SODIUM CHLORIDE 0.9 % IV SOLN
1.0000 g | Freq: Once | INTRAVENOUS | Status: AC
  Administered 2022-10-03: 1 g via INTRAVENOUS
  Filled 2022-10-03: qty 10

## 2022-10-03 MED ORDER — SODIUM CHLORIDE 0.9 % IV SOLN
500.0000 mg | INTRAVENOUS | Status: DC
  Administered 2022-10-04: 500 mg via INTRAVENOUS
  Filled 2022-10-03: qty 5

## 2022-10-03 MED ORDER — HYDRALAZINE HCL 20 MG/ML IJ SOLN: 10.0000 mg | Freq: Four times a day (QID) | INTRAMUSCULAR | Status: DC | PRN

## 2022-10-03 MED ORDER — ATORVASTATIN CALCIUM 10 MG PO TABS
10.0000 mg | ORAL_TABLET | Freq: Every day | ORAL | Status: DC
  Administered 2022-10-03 – 2022-10-07 (×5): 10 mg via ORAL
  Filled 2022-10-03 (×5): qty 1

## 2022-10-03 MED ORDER — FLUTICASONE PROPIONATE 50 MCG/ACT NA SUSP
1.0000 | Freq: Every day | NASAL | Status: DC
  Administered 2022-10-04 – 2022-10-06 (×3): 1 via NASAL
  Filled 2022-10-03: qty 16

## 2022-10-03 MED ORDER — MAGNESIUM SULFATE 2 GM/50ML IV SOLN
2.0000 g | Freq: Once | INTRAVENOUS | Status: AC
  Administered 2022-10-03: 2 g via INTRAVENOUS
  Filled 2022-10-03: qty 50

## 2022-10-03 MED ORDER — PANTOPRAZOLE SODIUM 40 MG PO TBEC
40.0000 mg | DELAYED_RELEASE_TABLET | Freq: Every day | ORAL | Status: DC
  Administered 2022-10-04 – 2022-10-07 (×4): 40 mg via ORAL
  Filled 2022-10-03 (×4): qty 1

## 2022-10-03 MED ORDER — TRAZODONE HCL 50 MG PO TABS: 25.0000 mg | ORAL_TABLET | Freq: Every evening | ORAL | Status: DC | PRN

## 2022-10-03 MED ORDER — LABETALOL HCL 5 MG/ML IV SOLN: 20.0000 mg | INTRAVENOUS | Status: DC | PRN

## 2022-10-03 MED ORDER — ASPIRIN 81 MG PO TBEC
81.0000 mg | DELAYED_RELEASE_TABLET | Freq: Every day | ORAL | Status: DC
  Administered 2022-10-04 – 2022-10-07 (×4): 81 mg via ORAL
  Filled 2022-10-03 (×4): qty 1

## 2022-10-03 MED ORDER — SODIUM CHLORIDE 0.9 % IV SOLN: INTRAVENOUS | Status: DC

## 2022-10-03 MED ORDER — ACETAMINOPHEN 650 MG RE SUPP: 650.0000 mg | Freq: Four times a day (QID) | RECTAL | Status: DC | PRN

## 2022-10-03 MED ORDER — BUPROPION HCL ER (XL) 150 MG PO TB24
150.0000 mg | ORAL_TABLET | Freq: Every day | ORAL | Status: DC
  Administered 2022-10-03 – 2022-10-06 (×4): 150 mg via ORAL
  Filled 2022-10-03 (×4): qty 1

## 2022-10-03 MED ORDER — OSELTAMIVIR PHOSPHATE 30 MG PO CAPS
30.0000 mg | ORAL_CAPSULE | Freq: Once | ORAL | Status: AC
  Administered 2022-10-03: 30 mg via ORAL
  Filled 2022-10-03: qty 1

## 2022-10-03 MED ORDER — OSELTAMIVIR PHOSPHATE 75 MG PO CAPS: 75.0000 mg | ORAL_CAPSULE | Freq: Once | ORAL | Status: DC

## 2022-10-03 MED ORDER — CARVEDILOL 25 MG PO TABS
25.0000 mg | ORAL_TABLET | Freq: Two times a day (BID) | ORAL | Status: DC
  Administered 2022-10-04 – 2022-10-06 (×6): 25 mg via ORAL
  Filled 2022-10-03 (×6): qty 1

## 2022-10-03 MED ORDER — HYDROCOD POLI-CHLORPHE POLI ER 10-8 MG/5ML PO SUER: 5.0000 mL | Freq: Two times a day (BID) | ORAL | Status: DC | PRN

## 2022-10-03 MED ORDER — GUAIFENESIN ER 600 MG PO TB12
600.0000 mg | ORAL_TABLET | Freq: Two times a day (BID) | ORAL | Status: DC
  Administered 2022-10-04: 600 mg via ORAL
  Filled 2022-10-03: qty 1

## 2022-10-03 MED ORDER — IPRATROPIUM-ALBUTEROL 0.5-2.5 (3) MG/3ML IN SOLN
3.0000 mL | Freq: Once | RESPIRATORY_TRACT | Status: AC
  Administered 2022-10-03: 3 mL via RESPIRATORY_TRACT

## 2022-10-03 MED ORDER — ACETAMINOPHEN 325 MG PO TABS: 650.0000 mg | ORAL_TABLET | Freq: Four times a day (QID) | ORAL | Status: DC | PRN

## 2022-10-03 MED ORDER — DIPHENHYDRAMINE HCL 25 MG PO CAPS: 25.0000 mg | ORAL_CAPSULE | Freq: Every day | ORAL | Status: DC | PRN

## 2022-10-03 MED ORDER — FERROUS SULFATE 325 (65 FE) MG PO TABS
325.0000 mg | ORAL_TABLET | Freq: Every day | ORAL | Status: DC
  Administered 2022-10-04 – 2022-10-07 (×4): 325 mg via ORAL
  Filled 2022-10-03 (×4): qty 1

## 2022-10-03 MED ORDER — OSELTAMIVIR PHOSPHATE 30 MG PO CAPS
30.0000 mg | ORAL_CAPSULE | Freq: Two times a day (BID) | ORAL | Status: DC
  Administered 2022-10-04 – 2022-10-07 (×7): 30 mg via ORAL
  Filled 2022-10-03 (×7): qty 1

## 2022-10-03 MED ORDER — ENOXAPARIN SODIUM 40 MG/0.4ML IJ SOSY
40.0000 mg | PREFILLED_SYRINGE | INTRAMUSCULAR | Status: DC
  Administered 2022-10-03 – 2022-10-04 (×2): 40 mg via SUBCUTANEOUS
  Filled 2022-10-03 (×2): qty 0.4

## 2022-10-03 NOTE — ED Notes (Signed)
O2 noted to be 86% on 2L [place]. Increased to 4L [place] and now 91-92%. Will continue to monitor.

## 2022-10-03 NOTE — ED Notes (Signed)
Took patient care pt is resting awaiting MD

## 2022-10-03 NOTE — Assessment & Plan Note (Signed)
-   We will continue PPI therapy 

## 2022-10-03 NOTE — Assessment & Plan Note (Signed)
-   This is clearly secondary to #1 and #2. - O2 protocol will be followed. - Management otherwise as above.

## 2022-10-03 NOTE — Assessment & Plan Note (Addendum)
-   We will continue her antihypertensives. - She will be placed on as needed IV hydralazine and labetalol.

## 2022-10-03 NOTE — Discharge Instructions (Signed)

## 2022-10-03 NOTE — Progress Notes (Signed)
PHARMACY NOTE:  ANTIMICROBIAL RENAL DOSAGE ADJUSTMENT  Current antimicrobial regimen includes a mismatch between antimicrobial dosage and estimated renal function.  As per policy approved by the Pharmacy & Therapeutics and Medical Executive Committees, the antimicrobial dosage will be adjusted accordingly.  Current antimicrobial dosage:  Tamiflu 75 mg PO BID X 5 days   Indication: flu   Renal Function:  Estimated Creatinine Clearance: 58.6 mL/min (by C-G formula based on SCr of 0.51 mg/dL). []      On intermittent HD, scheduled: []      On CRRT    Antimicrobial dosage has been changed to:  Tamiflu 30 mg PO BID X 5 days   Additional comments:   Thank you for allowing pharmacy to be a part of this patient's care.  Orene Desanctis, [facility] 10/03/2022 10:52 PM

## 2022-10-03 NOTE — Assessment & Plan Note (Signed)
We will continue Wellbutrin XL and Prozac. 

## 2022-10-03 NOTE — ED Notes (Signed)
Patient is being discharged from the Urgent Care and sent to the Emergency Department via POV . Per Christene Slates, PA patient is in need of higher level of care due to Hypoxia. Patient is aware and verbalizes understanding of plan of care.  Vitals:   10/03/22 1149 10/03/22 1224  BP: (!) 178/63   Pulse: 67   Resp: 20   Temp: 99.3 F (37.4 C)   SpO2: (!) 89% 91%

## 2022-10-03 NOTE — Assessment & Plan Note (Signed)
-   Given the possibility of bacterial superinfection especially in the setting of acute hypoxia, will place on IV Rocephin and Zithromax. - Mucolytic therapy will be provided. - Bronchodilator therapy will be given. - We will follow blood cultures.

## 2022-10-03 NOTE — ED Provider Triage Note (Signed)
  Emergency Medicine Provider Triage Evaluation Note  Kimberly Hendrix , a 72 y.o.female,  was evaluated in triage.  Pt complains of shortness of breath.  Patient states that she was seen at urgent care today for fever, cough, fatigue, and weakness has been going on for the past week.  She was reportedly found to have low oxygen levels while urgent care.  She states that she feels mildly short of breath, but otherwise feels okay.   Review of Systems  Positive: Fever, cough, fatigue, shortness of breath. Negative: Denies abdominal pain, chest pain, vomiting  Physical Exam  There were no vitals filed for this visit. Gen:   Awake, no distress   Resp:  Normal effort  MSK:   Moves extremities without difficulty  Other:    Medical Decision Making  Given the patient's initial medical screening exam, the following diagnostic evaluation has been ordered. The patient will be placed in the appropriate treatment space, once one is available, to complete the evaluation and treatment. I have discussed the plan of care with the patient and I have advised the patient that an ED physician or mid-level practitioner will reevaluate their condition after the test results have been received, as the results may give them additional insight into the type of treatment they may need.    Diagnostics: Labs, CXR, respiratory panel, EKG  Treatments: none immediately   Teodoro Spray, [place] 10/03/22 1356

## 2022-10-03 NOTE — ED Triage Notes (Addendum)
Pt c/o cough, nasal congestion, fever. Started a little over a week ago. She states she was exposed to RSV at Christmas. She states the fever started about 4 days ago. Pt is wheezing during triage.

## 2022-10-03 NOTE — ED Notes (Signed)
Started patients medication, ambulated patient with a pulse ox, oxygen dropped from 92% to 88%

## 2022-10-03 NOTE — H&P (Signed)
[place]   PATIENT NAME: Kimberly Hendrix    MR#:  [id number]  DATE OF BIRTH:  Sep 29, 1951  DATE OF ADMISSION:  10/03/2022  PRIMARY CARE PHYSICIAN: System, Provider Not In   Patient is coming from: Home  REQUESTING/REFERRING PHYSICIAN: Rada Hay, MD  CHIEF COMPLAINT:   Chief Complaint  Patient presents with   Cough   Shortness of Breath    HISTORY OF PRESENT ILLNESS:  Kimberly Hendrix is a 72 y.o. Caucasian female with medical history significant for hypertension, dyslipidemia and anxiety, who presented to the emergency room with a [place] of worsening cough with associated dyspnea and wheezing over the last week.  She had a fever that was up to 102.  Her cough has been congested and she was not able to expectorate.  She admits to diarrhea without nausea or vomiting or abdominal pain.  No chest pain or palpitations.  She has been having diminished p.o. intake.  No headache or dizziness or blurred vision.  She has been vaccinated for influenza this season and at RSV vaccine as well.  She had 2 vaccines for COVID-19 and 2 boosters.  No dysuria, oliguria or hematuria or flank pain.  No bleeding diathesis.  ED Course: Upon presentation to the emergency room, BP was 170/63 and later 191/75 with pulse ox amatory of 86% on room air with ambulation and later 91 to 92% on 2 L of O2 by nasal cannula.  Labs revealed hypokalemia of 2.7 with hyponatremia 130 hypochloremia 87, calcium 8.4 with otherwise unremarkable BMP.  BNP was 324 and procalcitonin 0.21.  CBC showed no significant abnormalities except for mild thrombocytopenia 149.  COVID-19 PCR and RSV PCR came back negative influenza A antigen was positive and the antigen was negative. EKG as reviewed by me : EKG showed normal sinus rhythm with a rate of 67 with slightly poor R wave progression. Imaging: Two-view chest x-ray showed prominent bilateral interstitial opacities with possible more focal opacity in the left lung base that  could be seen with atypical infection.  Patchy airspace and groundglass opacities throughout both lungs as described most consistent with multifocal pneumonia with no evidence for effusion or pneumothorax.  It showed small hiatal hernia.  The patient was given p.o. Tamiflu, IV Rocephin and Zithromax as well as 2 g of IV magnesium sulfate, 10 [place] of IV potassium chloride and 40 mill equivalent p.o. in addition to 1 L bolus of IV normal saline.  She will be admitted to a medical telemetry bed for further evaluation and management.  Chest CT without contrast revealed PAST MEDICAL HISTORY:   Past Medical History:  Diagnosis Date   Anemia    Anxiety    Hyperlipidemia    Hypertension     PAST SURGICAL HISTORY:   Past Surgical History:  Procedure Laterality Date   ABDOMINAL HYSTERECTOMY     OPEN REDUCTION INTERNAL FIXATION (ORIF) DISTAL RADIAL FRACTURE Left 10/16/2017   Procedure: OPEN REDUCTION INTERNAL FIXATION (ORIF) DISTAL RADIAL FRACTURE;  Surgeon: Hessie Knows, MD;  Location: [facility] ORS;  Service: Orthopedics;  Laterality: Left;    SOCIAL HISTORY:   Social History   Tobacco Use   Smoking status: Never   Smokeless tobacco: Never  Substance Use Topics   Alcohol use: Yes    Alcohol/week: 2.0 standard drinks of alcohol    Types: 2 Glasses of wine per week    FAMILY HISTORY:   Family History  Problem Relation Age of Onset  Breast cancer Mother 63       expired at age 3   Hypertension Father    Heart attack Father    Diabetes Father     DRUG ALLERGIES:   Allergies  Allergen Reactions   Penicillins     "foot turned red" Has patient had a PCN reaction causing immediate rash, facial/tongue/throat swelling, SOB or lightheadedness with hypotension: No Has patient had a PCN reaction causing severe rash involving mucus membranes or skin necrosis: No Has patient had a PCN reaction that required hospitalization: No Has patient had a PCN reaction occurring within the last  10 years: No If all of the above answers are "NO", then may proceed with Cephalosporin use.     REVIEW OF SYSTEMS:   ROS As per history of present illness. All pertinent systems were reviewed above. Constitutional, HEENT, cardiovascular, respiratory, GI, GU, musculoskeletal, neuro, psychiatric, endocrine, integumentary and hematologic systems were reviewed and are otherwise negative/unremarkable except for positive findings mentioned above in the HPI.   MEDICATIONS AT HOME:   Prior to Admission medications   Medication Sig Start Date End Date Taking? Authorizing Provider  aspirin EC 81 MG tablet Take 81 mg by mouth daily.   Yes [provider]  atorvastatin (LIPITOR) 10 MG tablet Take 10 mg by mouth daily.   Yes [provider]  benazepril-hydrochlorthiazide (LOTENSIN HCT) 20-12.5 MG tablet Take 2 tablets by mouth daily.   Yes [provider]  buPROPion (WELLBUTRIN XL) 150 MG 24 hr tablet Take 150 mg by mouth at bedtime.    Yes [provider]  carvedilol (COREG) 25 MG tablet Take 25 mg by mouth 2 (two) times daily with a meal. 08/07/22  Yes [provider]  Cholecalciferol (VITAMIN D3) 2000 units TABS Take 2,000 Units by mouth daily.    Yes [provider]  ferrous sulfate 325 (65 FE) MG tablet Take 325 mg by mouth daily with breakfast.   Yes [provider]  FLUoxetine (PROZAC) 40 MG capsule Take 40 mg by mouth daily.   Yes [provider]  fluticasone (FLONASE) 50 MCG/ACT nasal spray Place 1 spray into both nostrils at bedtime.    Yes [provider]  Multiple Vitamins-Minerals (CENTRUM SILVER 50+WOMEN PO) Take 1 tablet by mouth daily.   Yes [provider]  olmesartan-hydrochlorothiazide (BENICAR HCT) 40-25 MG tablet Take 1 tablet by mouth daily. 08/07/22  Yes [provider]  omeprazole (PRILOSEC) 40 MG capsule Take 40 mg by mouth daily.   Yes [provider]  tretinoin (RETIN-A)  0.025 % cream Apply 1 Application topically at bedtime.   Yes [provider]  azithromycin (ZITHROMAX) 250 MG tablet Take 250 mg by mouth daily. Patient not taking: Reported on 10/03/2022 10/03/22 10/08/22  [provider]  benzonatate (TESSALON) 200 MG capsule Take 200 mg by mouth 3 (three) times daily as needed. Patient not taking: Reported on 10/03/2022 09/29/22 10/10/22  [provider]  diphenhydrAMINE (BENADRYL) 25 mg capsule Take 25 mg by mouth daily as needed for allergies.     [provider]  ibuprofen (ADVIL,MOTRIN) 200 MG tablet Take 800 mg by mouth 2 (two) times daily as needed for moderate pain.     [provider]      VITAL SIGNS:  Blood pressure (!) 191/75, pulse 77, temperature 98.6 F (37 C), temperature source Oral, resp. rate 18, SpO2 91 %.  PHYSICAL EXAMINATION:  Physical Exam  GENERAL:  72 y.o.-year-old Caucasian female patient lying  in the bed with no acute distress.  EYES: Pupils equal, round, reactive to light and accommodation. No scleral icterus. Extraocular muscles intact.  HEENT: Head atraumatic, normocephalic. Oropharynx and nasopharynx clear.  NECK:  Supple, no jugular venous distention. No thyroid enlargement, no tenderness.  LUNGS: Diminished bibasilar breath sounds with left basal crackles.  No use of accessory muscles of respiration.  CARDIOVASCULAR: Regular rate and rhythm, S1, S2 normal. No murmurs, rubs, or gallops.  ABDOMEN: Soft, nondistended, nontender. Bowel sounds present. No organomegaly or mass.  EXTREMITIES: No pedal edema, cyanosis, or clubbing.  NEUROLOGIC: Cranial nerves II through XII are intact. Muscle strength 5/5 in all extremities. Sensation intact. Gait not checked.  PSYCHIATRIC: The patient is alert and oriented x 3.  Normal affect and good eye contact. SKIN: No obvious rash, lesion, or ulcer.   LABORATORY PANEL:   CBC Recent Labs  Lab 10/03/22 1420  WBC 5.5  HGB 12.0  HCT 34.8*  PLT  149*   ------------------------------------------------------------------------------------------------------------------  Chemistries  Recent Labs  Lab 10/03/22 1420  NA 130*  K 2.7*  CL 87*  CO2 30  GLUCOSE 111*  BUN 12  CREATININE 0.51  CALCIUM 8.4*  MG 1.6*   ------------------------------------------------------------------------------------------------------------------  Cardiac Enzymes No results for input(s): "TROPONINI" in the last 168 hours. ------------------------------------------------------------------------------------------------------------------  RADIOLOGY:  CT CHEST WO CONTRAST  Result Date: 10/03/2022 CLINICAL DATA:  Respiratory illness, nondiagnostic x-ray. Cough with nasal congestion and fever for 1 week. EXAM: CT CHEST WITHOUT CONTRAST TECHNIQUE: Multidetector CT imaging of the chest was performed following the standard protocol without IV contrast. RADIATION DOSE REDUCTION: This exam was performed according to the departmental dose-optimization program which includes automated exposure control, adjustment of the mA and/or kV according to patient size and/or use of iterative reconstruction technique. COMPARISON:  Chest radiographs earlier the same date. No other comparison studies. FINDINGS: Cardiovascular: Minimal atherosclerosis of the aorta and great vessels. No acute vascular findings on noncontrast imaging. The heart size is normal. There is no pericardial effusion. Mediastinum/Nodes: There are no enlarged mediastinal, hilar or axillary lymph nodes.Hilar assessment is limited by the lack of intravenous contrast. Small hiatal hernia. The thyroid gland and trachea appear unremarkable. Lungs/Pleura: No pleural effusion or pneumothorax. There is diffuse central airway thickening with patchy airspace and ground-glass opacities throughout both lungs, most confluent in the superior segment of the right lower lobe, the right middle lobe, the inferior lingula and  inferior left lower lobe. Upper abdomen: No significant findings are seen within the visualized upper abdomen. Musculoskeletal/Chest wall: There is no chest wall mass or suspicious osseous finding. Thoracolumbar scoliosis associated with mild spondylosis. IMPRESSION: 1. Patchy airspace and ground-glass opacities throughout both lungs as described, most consistent with multifocal pneumonia. Radiographic follow-up recommended to ensure resolution. 2. No evidence of pleural effusion or pneumothorax. 3. Small hiatal hernia. Electronically Signed   By: Richardean Sale M.D.   On: 10/03/2022 17:00   DG Chest 2 View  Result Date: 10/03/2022 CLINICAL DATA:  Shortness of breath EXAM: CHEST - 2 VIEW COMPARISON:  None FINDINGS: No pleural effusion. No pneumothorax. Normal cardiac and mediastinal contours. Hazy opacity at the left lung base represent atelectasis or infection in the right clinical setting. Foramina bilateral interstitial opacities, which can be seen in the setting of atypical infection. No displaced rib fractures. Visualized upper abdomen is unremarkable. IMPRESSION: Prominent bilateral interstitial opacities with a possible more focal opacity at the left lung base. Findings could be seen in the setting of atypical infection. Electronically Signed  By: Lorenza Cambridge M.D.   On: 10/03/2022 14:47      IMPRESSION AND PLAN:  Assessment and Plan: * Influenzal pneumonia - The patient will be admitted to a medical telemetry bed. - We will continue her on p.o. Tamiflu. - She will be hydrated with IV normal saline.  Community acquired pneumonia of left lower lobe of lung - Given the possibility of bacterial superinfection especially in the setting of acute hypoxia, will place on IV Rocephin and Zithromax. - Mucolytic therapy will be provided. - Bronchodilator therapy will be given. - We will follow blood cultures.  Acute respiratory failure with hypoxia (HCC) - This is clearly secondary to #1 and  #2. - O2 protocol will be followed. - Management otherwise as above.  Hypertensive urgency - We will continue her antihypertensives. - She will be placed on as needed IV hydralazine and labetalol.  Depression - We will continue Wellbutrin XL and Prozac.  GERD without esophagitis - We will continue PPI therapy.  Dyslipidemia - We will continue statin therapy.   DVT prophylaxis: Lovenox. Advanced Care Planning:  Code Status: full code. Family Communication:  The plan of care was discussed in details with the patient (and family). I answered all questions. The patient agreed to proceed with the above mentioned plan. Further management will depend upon hospital course. Disposition Plan: Back to previous home environment Consults called: none. All the records are reviewed and case discussed with ED provider.  Status is: Inpatient   At the time of the admission, it appears that the appropriate admission status for this patient is inpatient.  This is judged to be reasonable and necessary in order to provide the required intensity of service to ensure the patient's safety given the presenting symptoms, physical exam findings and initial radiographic and laboratory data in the context of comorbid conditions.  The patient requires inpatient status due to high intensity of service, high risk of further deterioration and high frequency of surveillance required.  I certify that at the time of admission, it is my clinical judgment that the patient will require inpatient hospital care extending more than 2 midnights.                            Dispo: The patient is from: Home              Anticipated d/c is to: Home              Patient currently is not medically stable to d/c.              Difficult to place patient: No  Hannah Beat M.D on 10/03/2022 at 11:48 PM  [facility]   From 7 PM-7 AM, contact night-coverage [email or web address]  CC: Primary care physician; System, Provider Not  In

## 2022-10-03 NOTE — ED Provider Notes (Signed)
[facility]    CSN: [id number] Arrival date & time: 10/03/22  1025      History   Chief Complaint Chief Complaint  Patient presents with   Cough    HPI LAWRENCE ROLDAN is a 72 y.o. female with history of anemia, hypertension and hyperlipidemia.  She presents today for being ill for about a week.  States over the past 5 to 6 days she has had fever, increased fatigue and weakness.  She denies any shortness of breath.  She says she has been exposed to RSV.  She reports contacting her PCP and they advised that she be seen in person.  States she has been trying over-the-counter cough medicine without relief.  Patient has no history of cardiopulmonary disease.  Denies ever smoking.  HPI  Past Medical History:  Diagnosis Date   Anemia    Anxiety    Hyperlipidemia    Hypertension     There are no problems to display for this patient.   Past Surgical History:  Procedure Laterality Date   ABDOMINAL HYSTERECTOMY     OPEN REDUCTION INTERNAL FIXATION (ORIF) DISTAL RADIAL FRACTURE Left 10/16/2017   Procedure: OPEN REDUCTION INTERNAL FIXATION (ORIF) DISTAL RADIAL FRACTURE;  Surgeon: Hessie Knows, MD;  Location: [facility] ORS;  Service: Orthopedics;  Laterality: Left;    OB History   No obstetric history on file.      Home Medications    Prior to Admission medications   Medication Sig Start Date End Date Taking? Authorizing Provider  amLODipine (NORVASC) 5 MG tablet Take 5 mg by mouth daily.   Yes [provider]  aspirin EC 81 MG tablet Take 81 mg by mouth daily.   Yes [provider]  atorvastatin (LIPITOR) 10 MG tablet Take 10 mg by mouth daily.   Yes [provider]  benazepril-hydrochlorthiazide (LOTENSIN HCT) 20-12.5 MG tablet Take 2 tablets by mouth daily.   Yes [provider]  buPROPion (WELLBUTRIN XL) 150 MG 24 hr tablet Take 150 mg by mouth at bedtime.    Yes [provider]  Cholecalciferol (VITAMIN D3) 2000 units TABS  Take 2,000 Units by mouth daily.    Yes [provider]  diphenhydrAMINE (BENADRYL) 25 mg capsule Take 25 mg by mouth daily as needed for allergies.    Yes [provider]  ferrous sulfate 325 (65 FE) MG tablet Take 325 mg by mouth daily with breakfast.   Yes [provider]  FLUoxetine (PROZAC) 20 MG tablet Take 20 mg by mouth daily.   Yes [provider]  fluticasone (FLONASE) 50 MCG/ACT nasal spray Place 1 spray into both nostrils at bedtime.    Yes [provider]  metoprolol succinate (TOPROL-XL) 50 MG 24 hr tablet Take 50 mg by mouth daily. Take with or immediately following a meal.   Yes [provider]  Multiple Vitamins-Minerals (CENTRUM SILVER 50+WOMEN PO) Take 1 tablet by mouth daily.   Yes [provider]  omeprazole (PRILOSEC) 40 MG capsule Take 40 mg by mouth daily.   Yes [provider]  ibuprofen (ADVIL,MOTRIN) 200 MG tablet Take 800 mg by mouth 2 (two) times daily as needed for moderate pain.     [provider]    Family History Family History  Problem Relation Age of Onset   Breast cancer Mother 60       expired at age 55   Hypertension Father    Heart attack Father    Diabetes Father  Social History Social History   Tobacco Use   Smoking status: Never   Smokeless tobacco: Never  Vaping Use   Vaping Use: Never used  Substance Use Topics   Alcohol use: Yes    Alcohol/week: 2.0 standard drinks of alcohol    Types: 2 Glasses of wine per week   Drug use: No     Allergies   Penicillins   Review of Systems Review of Systems  Constitutional:  Positive for fatigue and fever. Negative for chills and diaphoresis.  HENT:  Positive for congestion and rhinorrhea. Negative for ear pain, sinus pressure, sinus pain and sore throat.   Respiratory:  Positive for cough. Negative for shortness of breath.   Gastrointestinal:  Negative for abdominal pain, nausea and vomiting.   Musculoskeletal:  Negative for arthralgias and myalgias.  Skin:  Negative for rash.  Neurological:  Positive for weakness. Negative for headaches.  Hematological:  Negative for adenopathy.     Physical Exam Triage Vital Signs ED Triage Vitals  Enc Vitals Group     BP 10/03/22 1149 (!) 178/63     Pulse Rate 10/03/22 1149 67     Resp 10/03/22 1149 20     Temp 10/03/22 1149 99.3 F (37.4 C)     Temp Source 10/03/22 1149 Oral     SpO2 10/03/22 1149 (!) 89 %     Weight 10/03/22 1147 149 lb 14.6 oz (68 kg)     Height 10/03/22 1147 5\' 6"  (1.676 m)     Head Circumference --      Peak Flow --      Pain Score 10/03/22 1147 0     Pain Loc --      Pain Edu? --      Excl. in [place]? --    No data found.  Updated Vital Signs BP (!) 178/63 (BP Location: Left Arm)   Pulse 67   Temp 99.3 F (37.4 C) (Oral)   Resp 20   Ht 5\' 6"  (1.676 m)   Wt 149 lb 14.6 oz (68 kg)   SpO2 91% Comment: 1-3 minute post nebulizer treatment  BMI 24.20 kg/m      Physical Exam Vitals and nursing note reviewed.  Constitutional:      General: She is not in acute distress.    Appearance: Normal appearance. She is ill-appearing. She is not toxic-appearing.  HENT:     Head: Normocephalic and atraumatic.     Nose: Congestion present.     Mouth/Throat:     Mouth: Mucous membranes are moist.     Pharynx: Oropharynx is clear.  Eyes:     General: No scleral icterus.       Right eye: No discharge.        Left eye: No discharge.     Conjunctiva/sclera: Conjunctivae normal.  Cardiovascular:     Rate and Rhythm: Normal rate and regular rhythm.     Heart sounds: Normal heart sounds.  Pulmonary:     Effort: Pulmonary effort is normal. No respiratory distress.     Breath sounds: Wheezing present.  Musculoskeletal:     Cervical back: Neck supple.  Skin:    General: Skin is dry.  Neurological:     General: No focal deficit present.     Mental Status: She is alert. Mental status is at baseline.     Motor: No  weakness.     Gait: Gait normal.  Psychiatric:        Mood and  Affect: Mood normal.        Behavior: Behavior normal.        Thought Content: Thought content normal.      UC Treatments / Results  Labs (all labs ordered are listed, but only abnormal results are displayed) Labs Reviewed  RESP PANEL BY RT-PCR (RSV, FLU A&B, COVID)  RVPGX2    EKG   Radiology No results found.  Procedures Procedures (including critical care time)  Medications Ordered in UC Medications  ipratropium-albuterol (DUONEB) 0.5-2.5 (3) MG/3ML nebulizer solution 3 mL (3 mLs Nebulization Given 10/03/22 1209)    Initial Impression / Assessment and Plan / UC Course  I have reviewed the triage vital signs and the nursing notes.  Pertinent labs & imaging results that were available during my care of the patient were reviewed by me and considered in my medical decision making (see chart for details).   72 year old female presents for 1 week history of cough and congestion.  Over the past 5 days she has developed fever and increased fatigue and weakness.  She denies any shortness of breath.  She does have initial oxygen saturation of 89%.  She has no history of COPD, asthma or other respiratory problems or heart failure.  She has been exposed to RSV.  Blood pressure is elevated at 178/63.  She is currently afebrile.  She is actually not in any respiratory distress and is speaking in clear and full sentences.  Nontoxic.  On exam she has nasal congestion as well as diffuse wheezing.  Respiratory panel was obtained by nursing staff.  Attempted DuoNeb to see if that would increase her oxygen.  It did temporarily within the oxygen went back down to 90 to 91%.  She reported that it seemed to help her.  Patient placed on 2 L oxygen when she raises her O2 to 96%.  Given the fact that her oxygen is at 90% and the DuoNeb did not really make a big difference, advised her that she needs further workup in the emergency  department but high suspicion for RSV and possible underlying pneumonia.  Patient does not want to go via EMS and says that her friend will take her.  Her friend is with her today.  Patient is stable and again she is not showing any signs of respiratory distress despite a low oxygen level.  Patient to go to [facility].   Final Clinical Impressions(s) / UC Diagnoses   Final diagnoses:  Acute respiratory failure with hypoxia (HCC)  Acute cough  RSV exposure     Discharge Instructions      You have been advised to follow up immediately in the emergency department for concerning signs.symptoms. If you declined EMS transport, please have a family member take you directly to the ED at this time. Do not delay. Based on concerns about condition, if you do not follow up in th e ED, you may risk poor outcomes including worsening of condition, delayed treatment and potentially life threatening issues. If you have declined to go to the ED at this time, you should call your PCP immediately to set up a follow up appointment.  Go to ED for red flag symptoms, including; fevers you cannot reduce with Tylenol/Motrin, severe headaches, vision changes, numbness/weakness in part of the body, lethargy, confusion, intractable vomiting, severe dehydration, chest pain, breathing difficulty, severe persistent abdominal or pelvic pain, signs of severe infection (increased redness, swelling of an area), feeling faint or passing out, dizziness, etc. You should especially go  to the ED for sudden acute worsening of condition if you do not elect to go at this time.    ED Prescriptions   None    PDMP not reviewed this encounter.   Shirlee Latch, PA-C 10/03/22 1240

## 2022-10-03 NOTE — ED Provider Notes (Signed)
[facility] Provider Note    Event Date/Time   First MD Initiated Contact with Patient 10/03/22 1848     (approximate)   History   Cough and Shortness of Breath   HPI  Kimberly Hendrix is a 72 y.o. female medical history of anemia hypertension hyperlipidemia who presents with shortness of breath.  Patient started feeling 6 on 12/26.  Has had cough congestion and some dyspnea.  Has a decreased appetite as well but is still tolerating p.o.  No vomiting diarrhea.  Denies chest pain.  There have been family members with RSV.  She went to urgent care today and was found to be hypoxic and tested positive for influenza A.  Patient denies any history of respiratory issues.  She is not having significant dyspnea at rest.     Past Medical History:  Diagnosis Date  . Anemia   . Anxiety   . Hyperlipidemia   . Hypertension     There are no problems to display for this patient.    Physical Exam  Triage Vital Signs: ED Triage Vitals  Enc Vitals Group     BP 10/03/22 1410 (!) 188/63     Pulse Rate 10/03/22 1410 67     Resp 10/03/22 1410 18     Temp 10/03/22 1410 98.4 F (36.9 C)     Temp Source 10/03/22 1410 Oral     SpO2 10/03/22 1410 92 %     Weight --      Height --      Head Circumference --      Peak Flow --      Pain Score 10/03/22 1411 0     Pain Loc --      Pain Edu? --      Excl. in [place]? --     Most recent vital signs: Vitals:   10/03/22 1410 10/03/22 2146  BP: (!) 188/63   Pulse: 67   Resp: 18   Temp: 98.4 F (36.9 C)   SpO2: 92% (!) 86%     General: Awake, no distress.  CV:  Good peripheral perfusion.  Resp:  Normal effort.  Wet sounding cough, scattered rhonchi, good air movement Abd:  No distention.  Neuro:             Awake, Alert, Oriented x 3  Other:     ED Results / Procedures / Treatments  Labs (all labs ordered are listed, but only abnormal results are displayed) Labs Reviewed  CBC WITH DIFFERENTIAL/PLATELET -  Abnormal; Notable for the following components:      Result Value   HCT 34.8 (*)    Platelets 149 (*)    Lymphs Abs 0.6 (*)    All other components within normal limits  BASIC METABOLIC PANEL - Abnormal; Notable for the following components:   Sodium 130 (*)    Potassium 2.7 (*)    Chloride 87 (*)    Glucose, Bld 111 (*)    Calcium 8.4 (*)    All other components within normal limits  BRAIN NATRIURETIC PEPTIDE - Abnormal; Notable for the following components:   B Natriuretic Peptide 324.0 (*)    All other components within normal limits  MAGNESIUM - Abnormal; Notable for the following components:   Magnesium 1.6 (*)    All other components within normal limits  RESP PANEL BY RT-PCR (RSV, FLU A&B, COVID)  RVPGX2  PROCALCITONIN     EKG  EKG reviewed interpreted myself shows  sinus rhythm, normal axis and normal intervals, there is significant baseline artifact that limits EKG interpretation   RADIOLOGY CT chest reviewed interpreted myself shows multifocal pneumonia   PROCEDURES:  Critical Care performed: Yes, see critical care procedure note(s)  Procedures  The patient is on the cardiac monitor to evaluate for evidence of arrhythmia and/or significant heart rate changes.   MEDICATIONS ORDERED IN ED: Medications  potassium chloride 10 mEq in 100 mL IVPB (10 mEq Intravenous New Bag/Given 10/03/22 2052)  azithromycin (ZITHROMAX) 500 mg in sodium chloride 0.9 % 250 mL IVPB (has no administration in time range)  magnesium sulfate IVPB 2 g 50 mL (has no administration in time range)  sodium chloride 0.9 % bolus 1,000 mL (1,000 mLs Intravenous New Bag/Given 10/03/22 2050)  potassium chloride SA (KLOR-CON M) CR tablet 40 mEq (40 mEq Oral Given 10/03/22 2107)  cefTRIAXone (ROCEPHIN) 1 g in sodium chloride 0.9 % 100 mL IVPB (1 g Intravenous New Bag/Given 10/03/22 2106)     IMPRESSION / MDM / ASSESSMENT AND PLAN / ED COURSE  I reviewed the triage vital signs and the nursing notes.                               Patient's presentation is most consistent with acute presentation with potential threat to life or bodily function.  Differential diagnosis includes, but is not limited to, influenza a pneumonia, bacterial pneumonia, CHF exacerbation, myocarditis/pericarditis  The patient is a 72 year old female who presents today because of cough congestion shortness of breath.  She has been sick for about 7 days.  She has exposed to RSV.  Went to urgent care today and tested positive for influenza A.  Initial pulse ox in triage was 92%.  Patient with ambulation desats to 86%.  She is not having significant dyspnea at rest.  He does have what sounding cough with some scattered rhonchi.  Labs are notable for potassium of 2.7 and magnesium of 1.6.  She has no leukocytosis BMP mildly elevated at 320.  Chest x-ray concerning for pneumonia.  A CT chest without contrast was ordered from triage which is also consistent with a multifocal pneumonia.  Patient given ceftriaxone and azithromycin to cover for potential bacterial superinfection.  Will add on a procalcitonin.  Will give Tamiflu.  Patient was given both p.o. and IV potassium and IV magnesium.  She require admission given her hypoxia. Clinical Course as of 10/03/22 2157  Tue Oct 03, 2022  2156 Influenza A By PCR(!): POSITIVE [KM]    Clinical Course User Index [KM] Rada Hay, MD     FINAL CLINICAL IMPRESSION(S) / ED DIAGNOSES   Final diagnoses:  Influenza A  Multifocal pneumonia  Acute respiratory failure with hypoxia ([place])     Rx / DC Orders   ED Discharge Orders     None        Note:  This document was prepared using Dragon voice recognition software and may include unintentional dictation errors.   Rada Hay, MD 10/03/22 2151

## 2022-10-03 NOTE — Assessment & Plan Note (Signed)
-   The patient will be admitted to a medical telemetry bed. - We will continue her on p.o. Tamiflu. - She will be hydrated with IV normal saline.

## 2022-10-03 NOTE — ED Triage Notes (Addendum)
Pt to ED via POV from home. Pt reports cough, congestion and SOB x1 wk. Pt states granddaughter dx with RSV. Pt also reports fever x2 days with highest reading 102. Pt seen at [facility] and referred to ER for further testing. UC respiratory panel positive for Flu A.

## 2022-10-03 NOTE — Assessment & Plan Note (Signed)
-   We will continue statin therapy. 

## 2022-10-04 ENCOUNTER — Encounter: Payer: Self-pay | Admitting: Family Medicine

## 2022-10-04 DIAGNOSIS — E876 Hypokalemia: Secondary | ICD-10-CM

## 2022-10-04 DIAGNOSIS — J1100 Influenza due to unidentified influenza virus with unspecified type of pneumonia: Secondary | ICD-10-CM | POA: Diagnosis not present

## 2022-10-04 LAB — BASIC METABOLIC PANEL
Anion gap: 9 (ref 5–15)
BUN: 11 mg/dL (ref 8–23)
CO2: 28 mmol/L (ref 22–32)
Calcium: 7.7 mg/dL — ABNORMAL LOW (ref 8.9–10.3)
Chloride: 95 mmol/L — ABNORMAL LOW (ref 98–111)
Creatinine, Ser: 0.42 mg/dL — ABNORMAL LOW (ref 0.44–1.00)
GFR, Estimated: >60 mL/min (ref >60)
Glucose, Bld: 114 mg/dL — ABNORMAL HIGH (ref 70–99)
Potassium: 3.9 mmol/L (ref 3.5–5.1)
Sodium: 132 mmol/L — ABNORMAL LOW (ref 135–145)

## 2022-10-04 LAB — CBC
HCT: 32.2 % — ABNORMAL LOW (ref 36.0–46.0)
Hemoglobin: 10.8 g/dL — ABNORMAL LOW (ref 12.0–15.0)
MCH: 28 pg (ref 26.0–34.0)
MCHC: 33.5 g/dL (ref 30.0–36.0)
MCV: 83.4 fL (ref 80.0–100.0)
Platelets: 160 10*3/uL (ref 150–400)
RBC: 3.86 MIL/uL — ABNORMAL LOW (ref 3.87–5.11)
RDW: 14.7 % (ref 11.5–15.5)
WBC: 5.7 10*3/uL (ref 4.0–10.5)
nRBC: 0 % (ref 0.0–0.2)

## 2022-10-04 LAB — BRAIN NATRIURETIC PEPTIDE: B Natriuretic Peptide: 317.5 pg/mL — ABNORMAL HIGH (ref 0.0–100.0)

## 2022-10-04 MED ORDER — TRAZODONE HCL 50 MG PO TABS
50.0000 mg | ORAL_TABLET | Freq: Every evening | ORAL | Status: DC | PRN
  Administered 2022-10-06: 50 mg via ORAL
  Filled 2022-10-04: qty 1

## 2022-10-04 MED ORDER — HYDROCHLOROTHIAZIDE 25 MG PO TABS
25.0000 mg | ORAL_TABLET | Freq: Every day | ORAL | Status: DC
  Filled 2022-10-04: qty 1

## 2022-10-04 MED ORDER — FUROSEMIDE 10 MG/ML IJ SOLN
20.0000 mg | Freq: Once | INTRAMUSCULAR | Status: AC
  Administered 2022-10-04: 20 mg via INTRAVENOUS
  Filled 2022-10-04: qty 4

## 2022-10-04 MED ORDER — IPRATROPIUM-ALBUTEROL 0.5-2.5 (3) MG/3ML IN SOLN
3.0000 mL | RESPIRATORY_TRACT | Status: DC | PRN
  Administered 2022-10-04: 3 mL via RESPIRATORY_TRACT

## 2022-10-04 MED ORDER — HYDROCHLOROTHIAZIDE 25 MG PO TABS
25.0000 mg | ORAL_TABLET | Freq: Every day | ORAL | Status: DC
  Administered 2022-10-04 – 2022-10-07 (×4): 25 mg via ORAL
  Filled 2022-10-04 (×4): qty 1

## 2022-10-04 MED ORDER — METOPROLOL TARTRATE 5 MG/5ML IV SOLN
5.0000 mg | INTRAVENOUS | Status: DC | PRN
  Administered 2022-10-05: 5 mg via INTRAVENOUS
  Filled 2022-10-04: qty 5

## 2022-10-04 MED ORDER — HYDRALAZINE HCL 20 MG/ML IJ SOLN
10.0000 mg | INTRAMUSCULAR | Status: DC | PRN
  Administered 2022-10-06 – 2022-10-07 (×3): 10 mg via INTRAVENOUS
  Filled 2022-10-04 (×3): qty 1

## 2022-10-04 MED ORDER — SENNOSIDES-DOCUSATE SODIUM 8.6-50 MG PO TABS: 1.0000 | ORAL_TABLET | Freq: Every evening | ORAL | Status: DC | PRN

## 2022-10-04 MED ORDER — MAGNESIUM SULFATE 2 GM/50ML IV SOLN
2.0000 g | Freq: Once | INTRAVENOUS | Status: AC
  Administered 2022-10-04: 2 g via INTRAVENOUS
  Filled 2022-10-04: qty 50

## 2022-10-04 MED ORDER — IRBESARTAN 150 MG PO TABS
300.0000 mg | ORAL_TABLET | Freq: Every day | ORAL | Status: DC
  Administered 2022-10-04 – 2022-10-07 (×4): 300 mg via ORAL
  Filled 2022-10-04 (×4): qty 2

## 2022-10-04 MED ORDER — IPRATROPIUM-ALBUTEROL 0.5-2.5 (3) MG/3ML IN SOLN
3.0000 mL | Freq: Three times a day (TID) | RESPIRATORY_TRACT | Status: DC
  Administered 2022-10-04: 3 mL via RESPIRATORY_TRACT
  Filled 2022-10-04 (×2): qty 3

## 2022-10-04 MED ORDER — GUAIFENESIN 100 MG/5ML PO LIQD
5.0000 mL | ORAL | Status: DC | PRN
  Administered 2022-10-05: 5 mL via ORAL
  Filled 2022-10-04: qty 10

## 2022-10-04 MED ORDER — BENAZEPRIL HCL 20 MG PO TABS
40.0000 mg | ORAL_TABLET | Freq: Every day | ORAL | Status: DC
  Administered 2022-10-04: 40 mg via ORAL
  Filled 2022-10-04: qty 2

## 2022-10-04 MED ORDER — DM-GUAIFENESIN ER 30-600 MG PO TB12
1.0000 | ORAL_TABLET | Freq: Two times a day (BID) | ORAL | Status: DC
  Administered 2022-10-04 – 2022-10-07 (×7): 1 via ORAL
  Filled 2022-10-04 (×7): qty 1

## 2022-10-04 NOTE — Progress Notes (Signed)
PROGRESS NOTE    Kimberly Hendrix  KZL:[id number] DOB: Feb 06, 1951 DOA: 10/03/2022 PCP: System, Provider Not In   Brief Narrative:   72 y.o. Caucasian female with medical history significant for hypertension, dyslipidemia and anxiety, who presented to the emergency room with a [place] of worsening cough with associated dyspnea and wheezing over the last week.  She had a fever that was up to 102.  Patient was found to be influenza A positive, COVID/RSV was negative.  Chest x-ray showed bilateral interstitial opacity.  Patient was started on Tamiflu, IV Rocephin and azithromycin.  Assessment & Plan:  Principal Problem:   Influenzal pneumonia Active Problems:   Community acquired pneumonia of left lower lobe of lung   Acute respiratory failure with hypoxia (HCC)   Hypertensive urgency   Hypokalemia   Dyslipidemia   GERD without esophagitis   Depression     Assessment and Plan: * Influenzal pneumonia - Started on 5 days of p.o. Tamiflu.  Supportive care.  Community acquired pneumonia of left lower lobe of lung - Likely causing hypoxia.  Will check procalcitonin and BNP.  Continue IV Rocephin and azithromycin.  Supportive care, bronchodilators.  I-S/flutter valve.  Acute respiratory failure with hypoxia (HCC) - This is clearly secondary to #1 and #2.  Hypertensive urgency - Continue home meds.  IV as needed ordered  Hypokalemia/hypomagnesemia - Replete as needed  Depression - We will continue Wellbutrin XL and Prozac.  GERD without esophagitis - PPI  Dyslipidemia - Lipitor      DVT prophylaxis: Lovenox Code Status: DNI Family Communication: Daughter at bedside  Status is: Inpatient Continue hospital stay due to respiratory distress    Subjective:  When I saw the patient she was on 4 L nasal cannula.  I turned her down to 3 L, with prolonged conversation her oxygen was dropping down to low 90s.  With ambulation it had dropped down to low 80s.  Overall feels  better  Examination:  General exam: Appears calm and comfortable  Respiratory system: Bilateral rhonchi Cardiovascular system: S1 & S2 heard, RRR. No JVD, murmurs, rubs, gallops or clicks. No pedal edema. Gastrointestinal system: Abdomen is nondistended, soft and nontender. No organomegaly or masses felt. Normal bowel sounds heard. Central nervous system: Alert and oriented. No focal neurological deficits. Extremities: Symmetric 5 x 5 power. Skin: No rashes, lesions or ulcers Psychiatry: Judgement and insight appear normal. Mood & affect appropriate.     Objective: Vitals:   10/04/22 0539 10/04/22 0543 10/04/22 0544 10/04/22 0600  BP:    (!) 163/68  Pulse: 76   75  Resp: (!) 22     Temp: 98.2 F (36.8 C)     TempSrc: Oral     SpO2: 95% 96%  94%  Weight:   67.6 kg   Height:   5\' 5"  (1.651 m)    No intake or output data in the 24 hours ending 10/04/22 0926 Filed Weights   10/04/22 0544  Weight: 67.6 kg     Data Reviewed:   CBC: Recent Labs  Lab 10/03/22 1420 10/04/22 0543  WBC 5.5 5.7  NEUTROABS 4.5  --   HGB 12.0 10.8*  HCT 34.8* 32.2*  MCV 81.7 83.4  PLT 149* 160   Basic Metabolic Panel: Recent Labs  Lab 10/03/22 1420 10/04/22 0543  NA 130* 132*  K 2.7* 3.9  CL 87* 95*  CO2 30 28  GLUCOSE 111* 114*  BUN 12 11  CREATININE 0.51 0.42*  CALCIUM 8.4* 7.7*  MG 1.6*  --    GFR: Estimated Creatinine Clearance: 56.4 mL/min (A) (by C-G formula based on SCr of 0.42 mg/dL (L)). Liver Function Tests: No results for input(s): "AST", "ALT", "ALKPHOS", "BILITOT", "PROT", "ALBUMIN" in the last 168 hours. No results for input(s): "LIPASE", "AMYLASE" in the last 168 hours. No results for input(s): "AMMONIA" in the last 168 hours. Coagulation Profile: No results for input(s): "INR", "PROTIME" in the last 168 hours. Cardiac Enzymes: No results for input(s): "CKTOTAL", "CKMB", "CKMBINDEX", "TROPONINI" in the last 168 hours. BNP (last 3 results) No results for  input(s): "PROBNP" in the last 8760 hours. HbA1C: No results for input(s): "HGBA1C" in the last 72 hours. CBG: No results for input(s): "GLUCAP" in the last 168 hours. Lipid Profile: No results for input(s): "CHOL", "HDL", "LDLCALC", "TRIG", "CHOLHDL", "LDLDIRECT" in the last 72 hours. Thyroid Function Tests: No results for input(s): "TSH", "T4TOTAL", "FREET4", "T3FREE", "THYROIDAB" in the last 72 hours. Anemia Panel: No results for input(s): "VITAMINB12", "FOLATE", "FERRITIN", "TIBC", "IRON", "RETICCTPCT" in the last 72 hours. Sepsis Labs: Recent Labs  Lab 10/03/22 1420  PROCALCITON 0.21    Recent Results (from the past 240 hour(s))  Resp panel by RT-PCR (RSV, Flu A&B, Covid) Anterior Nasal Swab     Status: Abnormal   Collection Time: 10/03/22 11:52 AM   Specimen: Anterior Nasal Swab  Result Value Ref Range Status   SARS Coronavirus 2 by RT PCR NEGATIVE NEGATIVE Final    Comment: (NOTE) SARS-CoV-2 target nucleic acids are NOT DETECTED.  The SARS-CoV-2 RNA is generally detectable in upper respiratory specimens during the acute phase of infection. The lowest concentration of SARS-CoV-2 viral copies this assay can detect is 138 copies/mL. A negative result does not preclude SARS-Cov-2 infection and should not be used as the sole basis for treatment or other patient management decisions. A negative result may occur with  improper specimen collection/handling, submission of specimen other than nasopharyngeal swab, presence of viral mutation(s) within the areas targeted by this assay, and inadequate number of viral copies(<138 copies/mL). A negative result must be combined with clinical observations, patient history, and epidemiological information. The expected result is Negative.  Fact Sheet for Patients:  EntrepreneurPulse.com.au  Fact Sheet for Healthcare Providers:  IncredibleEmployment.be  This test is no t yet approved or cleared by  the [place] FDA and  has been authorized for detection and/or diagnosis of SARS-CoV-2 by FDA under an Emergency Use Authorization (EUA). This EUA will remain  in effect (meaning this test can be used) for the duration of the COVID-19 declaration under Section 564(b)(1) of the Act, 21 U.S.C.section 360bbb-3(b)(1), unless the authorization is terminated  or revoked sooner.       Influenza A by PCR POSITIVE (A) NEGATIVE Final   Influenza B by PCR NEGATIVE NEGATIVE Final    Comment: (NOTE) The Xpert Xpress SARS-CoV-2/FLU/RSV plus assay is intended as an aid in the diagnosis of influenza from Nasopharyngeal swab specimens and should not be used as a sole basis for treatment. Nasal washings and aspirates are unacceptable for Xpert Xpress SARS-CoV-2/FLU/RSV testing.  Fact Sheet for Patients: EntrepreneurPulse.com.au  Fact Sheet for Healthcare Providers: IncredibleEmployment.be  This test is not yet approved or cleared by the [place] FDA and has been authorized for detection and/or diagnosis of SARS-CoV-2 by FDA under an Emergency Use Authorization (EUA). This EUA will remain in effect (meaning this test can be used) for the duration of the COVID-19 declaration under Section 564(b)(1) of the Act, 21 U.S.C. section  360bbb-3(b)(1), unless the authorization is terminated or revoked.     Resp Syncytial Virus by PCR NEGATIVE NEGATIVE Final    Comment: (NOTE) Fact Sheet for Patients: EntrepreneurPulse.com.au  Fact Sheet for Healthcare Providers: IncredibleEmployment.be  This test is not yet approved or cleared by the [place] FDA and has been authorized for detection and/or diagnosis of SARS-CoV-2 by FDA under an Emergency Use Authorization (EUA). This EUA will remain in effect (meaning this test can be used) for the duration of the COVID-19 declaration under Section 564(b)(1) of the Act, 21  U.S.C. section 360bbb-3(b)(1), unless the authorization is terminated or revoked.  Performed at [facility], [address], [place], [place] [address]   Resp panel by RT-PCR (RSV, Flu A&B, Covid) Anterior Nasal Swab     Status: Abnormal   Collection Time: 10/03/22  9:07 PM   Specimen: Anterior Nasal Swab  Result Value Ref Range Status   SARS Coronavirus 2 by RT PCR NEGATIVE NEGATIVE Final    Comment: (NOTE) SARS-CoV-2 target nucleic acids are NOT DETECTED.  The SARS-CoV-2 RNA is generally detectable in upper respiratory specimens during the acute phase of infection. The lowest concentration of SARS-CoV-2 viral copies this assay can detect is 138 copies/mL. A negative result does not preclude SARS-Cov-2 infection and should not be used as the sole basis for treatment or other patient management decisions. A negative result may occur with  improper specimen collection/handling, submission of specimen other than nasopharyngeal swab, presence of viral mutation(s) within the areas targeted by this assay, and inadequate number of viral copies(<138 copies/mL). A negative result must be combined with clinical observations, patient history, and epidemiological information. The expected result is Negative.  Fact Sheet for Patients:  EntrepreneurPulse.com.au  Fact Sheet for Healthcare Providers:  IncredibleEmployment.be  This test is no t yet approved or cleared by the [place] FDA and  has been authorized for detection and/or diagnosis of SARS-CoV-2 by FDA under an Emergency Use Authorization (EUA). This EUA will remain  in effect (meaning this test can be used) for the duration of the COVID-19 declaration under Section 564(b)(1) of the Act, 21 U.S.C.section 360bbb-3(b)(1), unless the authorization is terminated  or revoked sooner.       Influenza A by PCR POSITIVE (A) NEGATIVE Final   Influenza B by PCR NEGATIVE NEGATIVE Final     Comment: (NOTE) The Xpert Xpress SARS-CoV-2/FLU/RSV plus assay is intended as an aid in the diagnosis of influenza from Nasopharyngeal swab specimens and should not be used as a sole basis for treatment. Nasal washings and aspirates are unacceptable for Xpert Xpress SARS-CoV-2/FLU/RSV testing.  Fact Sheet for Patients: EntrepreneurPulse.com.au  Fact Sheet for Healthcare Providers: IncredibleEmployment.be  This test is not yet approved or cleared by the [place] FDA and has been authorized for detection and/or diagnosis of SARS-CoV-2 by FDA under an Emergency Use Authorization (EUA). This EUA will remain in effect (meaning this test can be used) for the duration of the COVID-19 declaration under Section 564(b)(1) of the Act, 21 U.S.C. section 360bbb-3(b)(1), unless the authorization is terminated or revoked.     Resp Syncytial Virus by PCR NEGATIVE NEGATIVE Final    Comment: (NOTE) Fact Sheet for Patients: EntrepreneurPulse.com.au  Fact Sheet for Healthcare Providers: IncredibleEmployment.be  This test is not yet approved or cleared by the [place] FDA and has been authorized for detection and/or diagnosis of SARS-CoV-2 by FDA under an Emergency Use Authorization (EUA). This EUA will remain in effect (meaning this test  can be used) for the duration of the COVID-19 declaration under Section 564(b)(1) of the Act, 21 U.S.C. section 360bbb-3(b)(1), unless the authorization is terminated or revoked.  Performed at [facility], [address] [place], [place] [address]          Radiology Studies: CT CHEST WO CONTRAST  Result Date: 10/03/2022 CLINICAL DATA:  Respiratory illness, nondiagnostic x-ray. Cough with nasal congestion and fever for 1 week. EXAM: CT CHEST WITHOUT CONTRAST TECHNIQUE: Multidetector CT imaging of the chest was performed following the standard protocol without  IV contrast. RADIATION DOSE REDUCTION: This exam was performed according to the departmental dose-optimization program which includes automated exposure control, adjustment of the mA and/or kV according to patient size and/or use of iterative reconstruction technique. COMPARISON:  Chest radiographs earlier the same date. No other comparison studies. FINDINGS: Cardiovascular: Minimal atherosclerosis of the aorta and great vessels. No acute vascular findings on noncontrast imaging. The heart size is normal. There is no pericardial effusion. Mediastinum/Nodes: There are no enlarged mediastinal, hilar or axillary lymph nodes.Hilar assessment is limited by the lack of intravenous contrast. Small hiatal hernia. The thyroid gland and trachea appear unremarkable. Lungs/Pleura: No pleural effusion or pneumothorax. There is diffuse central airway thickening with patchy airspace and ground-glass opacities throughout both lungs, most confluent in the superior segment of the right lower lobe, the right middle lobe, the inferior lingula and inferior left lower lobe. Upper abdomen: No significant findings are seen within the visualized upper abdomen. Musculoskeletal/Chest wall: There is no chest wall mass or suspicious osseous finding. Thoracolumbar scoliosis associated with mild spondylosis. IMPRESSION: 1. Patchy airspace and ground-glass opacities throughout both lungs as described, most consistent with multifocal pneumonia. Radiographic follow-up recommended to ensure resolution. 2. No evidence of pleural effusion or pneumothorax. 3. Small hiatal hernia. Electronically Signed   By: Carey Bullocks M.D.   On: 10/03/2022 17:00   DG Chest 2 View  Result Date: 10/03/2022 CLINICAL DATA:  Shortness of breath EXAM: CHEST - 2 VIEW COMPARISON:  None FINDINGS: No pleural effusion. No pneumothorax. Normal cardiac and mediastinal contours. Hazy opacity at the left lung base represent atelectasis or infection in the right clinical  setting. Foramina bilateral interstitial opacities, which can be seen in the setting of atypical infection. No displaced rib fractures. Visualized upper abdomen is unremarkable. IMPRESSION: Prominent bilateral interstitial opacities with a possible more focal opacity at the left lung base. Findings could be seen in the setting of atypical infection. Electronically Signed   By: Lorenza Cambridge M.D.   On: 10/03/2022 14:47        Scheduled Meds:  aspirin EC  81 mg Oral Daily   atorvastatin  10 mg Oral Daily   benazepril  40 mg Oral Daily   And   hydrochlorothiazide  25 mg Oral Daily   buPROPion  150 mg Oral QHS   carvedilol  25 mg Oral BID WC   cholecalciferol  2,000 Units Oral Daily   enoxaparin (LOVENOX) injection  40 mg Subcutaneous Q24H   ferrous sulfate  325 mg Oral Q breakfast   FLUoxetine  40 mg Oral Daily   fluticasone  1 spray Each Nare QHS   guaiFENesin  600 mg Oral BID   irbesartan  300 mg Oral Daily   And   hydrochlorothiazide  25 mg Oral Daily   ipratropium-albuterol  3 mL Nebulization QID   multivitamin with minerals  1 tablet Oral Daily   oseltamivir  30 mg Oral BID   pantoprazole  40 mg Oral Daily   Continuous Infusions:  sodium chloride 100 mL/hr at 10/04/22 0208   azithromycin     cefTRIAXone (ROCEPHIN)  IV       LOS: 1 day   Time spent= 35 mins    Dorie Ohms Joline Maxcy, MD [facility]  If 7PM-7AM, please contact night-coverage  10/04/2022, 9:26 AM

## 2022-10-04 NOTE — ED Notes (Signed)
Pt removed o2 while changing her outfit. Pt O2 sat dropped to 74% on room air with a good pleth on monitor. Pt states she does not feel shob without o2. Pt immediatly placed back on 4L [place]

## 2022-10-04 NOTE — Assessment & Plan Note (Signed)
-   This is associated with hypomagnesemia. - Potassium and magnesium will be aggressively replaced.

## 2022-10-04 NOTE — Progress Notes (Signed)
PT Cancellation Note  Patient Details Name: Kimberly Hendrix MRN: [id number] DOB: Jun 15, 1951   Cancelled Treatment:    Reason Eval/Treat Not Completed: Other (comment). Per discussion with OT, pt currently indep and at baseline level. Will sign off at this time. No further needs.   Cheikh Bramble 10/04/2022, 1:12 PM Greggory Stallion, PT, DPT, GCS [phone]

## 2022-10-04 NOTE — Evaluation (Signed)
Occupational Therapy Evaluation Patient Details Name: Kimberly Hendrix MRN: [id number] DOB: 20-May-1951 Today's Date: 10/04/2022   History of Present Illness Kimberly Hendrix is a 72 y.o. female medical history of anemia hypertension hyperlipidemia who presents with shortness of breath.  Patient started feeling 6 on 12/26.  Has had cough congestion and some dyspnea.  Has a decreased appetite as well but is still tolerating p.o.  No vomiting diarrhea.  Denies chest pain.  There have been family members with RSV.  She went to urgent care today and was found to be hypoxic and tested positive for influenza A.   Clinical Impression   Upon entering the room, pt supine in ED stretcher with family present in the room. Pt is agreeable to OT intervention. She endorses being Ind in self care and IADLs and lives at home alone with 2 pets. She does not use O2 at baseline and is on 3Ls during session. OT dons B socks without assistance and stands and ambulates in room on RA to sink for grooming tasks and return to sit on EOB. Initially O2 saturation above 93% and then desats to 82% once returning to bed. Pt quickly returns to 90% + with placement of 3Ls [place]. Pt does not need skilled acute OT intervention at this time and she agrees. OT to complete order.     Recommendations for follow up therapy are one component of a multi-disciplinary discharge planning process, led by the attending physician.  Recommendations may be updated based on patient status, additional functional criteria and insurance authorization.   Follow Up Recommendations  No OT follow up     Assistance Recommended at Discharge None     Functional Status Assessment  Patient has not had a recent decline in their functional status  Equipment Recommendations  None recommended by OT       Precautions / Restrictions Precautions Precautions: Fall      Mobility Bed Mobility Overal bed mobility: Independent                   Transfers Overall transfer level: Independent Equipment used: None                      Balance Overall balance assessment: Independent                                         ADL either performed or assessed with clinical judgement   ADL Overall ADL's : At baseline;Independent                                             Vision Patient Visual Report: No change from baseline              Pertinent Vitals/Pain Pain Assessment Pain Assessment: No/denies pain     Hand Dominance Right   Extremity/Trunk Assessment Upper Extremity Assessment Upper Extremity Assessment: Overall WFL for tasks assessed   Lower Extremity Assessment Lower Extremity Assessment: Overall WFL for tasks assessed       Communication Communication Communication: No difficulties   Cognition Arousal/Alertness: Awake/alert Behavior During Therapy: WFL for tasks assessed/performed Overall Cognitive Status: Within Functional Limits for tasks assessed  Home Living Family/patient expects to be discharged to:: Private residence Living Arrangements: Alone Available Help at Discharge: Family;Available PRN/intermittently Type of Home: House Home Access: Level entry     Home Layout: Two level;Able to live on main level with bedroom/bathroom Alternate Level Stairs-Number of Steps: flight   Bathroom Shower/Tub: Walk-in shower         Home Equipment: Shower seat - built in;Cane - single point          Prior Functioning/Environment Prior Level of Function : Independent/Modified Independent;Driving               ADLs Comments: Pt is independent in all aspects of ADLs and IADLs at baseline.                 OT Goals(Current goals can be found in the care plan section) Acute Rehab OT Goals Patient Stated Goal: to return home and feel better OT Goal Formulation: With  patient/family Time For Goal Achievement: 10/04/22 Potential to Achieve Goals: Good  OT Frequency:         AM-PAC OT "6 Clicks" Daily Activity     Outcome Measure Help from another person eating meals?: None Help from another person taking care of personal grooming?: None Help from another person toileting, which includes using toliet, bedpan, or urinal?: None Help from another person bathing (including washing, rinsing, drying)?: None Help from another person to put on and taking off regular upper body clothing?: None Help from another person to put on and taking off regular lower body clothing?: None 6 Click Score: 24   End of Session Nurse Communication: Mobility status  Activity Tolerance: Patient tolerated treatment well Patient left: in bed;with call bell/phone within reach;with family/visitor present                   Time: 9628-3662 OT Time Calculation (min): 19 min Charges:  OT General Charges $OT Visit: 1 Visit OT Evaluation $OT Eval Low Complexity: 1 Low OT Treatments $Self Care/Home Management : 8-22 mins  Darleen Crocker, MS, OTR/L , CBIS ascom [phone]  10/04/22, 12:36 PM

## 2022-10-05 ENCOUNTER — Other Ambulatory Visit: Payer: Self-pay

## 2022-10-05 DIAGNOSIS — J1100 Influenza due to unidentified influenza virus with unspecified type of pneumonia: Secondary | ICD-10-CM | POA: Diagnosis not present

## 2022-10-05 LAB — BASIC METABOLIC PANEL
Anion gap: 11 (ref 5–15)
BUN: 7 mg/dL — ABNORMAL LOW (ref 8–23)
CO2: 28 mmol/L (ref 22–32)
Calcium: 7.7 mg/dL — ABNORMAL LOW (ref 8.9–10.3)
Chloride: 94 mmol/L — ABNORMAL LOW (ref 98–111)
Creatinine, Ser: 0.43 mg/dL — ABNORMAL LOW (ref 0.44–1.00)
GFR, Estimated: >60 mL/min (ref >60)
Glucose, Bld: 99 mg/dL (ref 70–99)
Potassium: 3.1 mmol/L — ABNORMAL LOW (ref 3.5–5.1)
Sodium: 133 mmol/L — ABNORMAL LOW (ref 135–145)

## 2022-10-05 LAB — MAGNESIUM: Magnesium: 1.9 mg/dL (ref 1.7–2.4)

## 2022-10-05 LAB — CBC
HCT: 35 % — ABNORMAL LOW (ref 36.0–46.0)
Hemoglobin: 11.6 g/dL — ABNORMAL LOW (ref 12.0–15.0)
MCH: 28 pg (ref 26.0–34.0)
MCHC: 33.1 g/dL (ref 30.0–36.0)
MCV: 84.3 fL (ref 80.0–100.0)
Platelets: 191 10*3/uL (ref 150–400)
RBC: 4.15 MIL/uL (ref 3.87–5.11)
RDW: 14.8 % (ref 11.5–15.5)
WBC: 8.7 10*3/uL (ref 4.0–10.5)
nRBC: 0 % (ref 0.0–0.2)

## 2022-10-05 LAB — TSH: TSH: 0.18 u[IU]/mL — ABNORMAL LOW (ref 0.350–4.500)

## 2022-10-05 MED ORDER — ENOXAPARIN SODIUM 80 MG/0.8ML IJ SOSY
1.0000 mg/kg | PREFILLED_SYRINGE | Freq: Two times a day (BID) | INTRAMUSCULAR | Status: DC
  Administered 2022-10-05 – 2022-10-06 (×4): 67.5 mg via SUBCUTANEOUS
  Filled 2022-10-05 (×4): qty 0.68
  Filled 2022-10-05: qty 0.8

## 2022-10-05 MED ORDER — DILTIAZEM HCL-DEXTROSE 125-5 MG/125ML-% IV SOLN (PREMIX)
5.0000 mg/h | INTRAVENOUS | Status: DC
  Administered 2022-10-05: 5 mg/h via INTRAVENOUS
  Filled 2022-10-05: qty 125

## 2022-10-05 MED ORDER — AZITHROMYCIN 250 MG PO TABS
500.0000 mg | ORAL_TABLET | Freq: Every day | ORAL | Status: DC
  Administered 2022-10-05 – 2022-10-06 (×2): 500 mg via ORAL
  Filled 2022-10-05: qty 1
  Filled 2022-10-05: qty 2

## 2022-10-05 MED ORDER — LEVALBUTEROL HCL 1.25 MG/0.5ML IN NEBU
1.2500 mg | INHALATION_SOLUTION | Freq: Two times a day (BID) | RESPIRATORY_TRACT | Status: DC
  Administered 2022-10-05 – 2022-10-07 (×5): 1.25 mg via RESPIRATORY_TRACT
  Filled 2022-10-05 (×5): qty 0.5

## 2022-10-05 MED ORDER — IPRATROPIUM BROMIDE 0.02 % IN SOLN
0.5000 mg | Freq: Two times a day (BID) | RESPIRATORY_TRACT | Status: DC
  Administered 2022-10-05 – 2022-10-07 (×5): 0.5 mg via RESPIRATORY_TRACT
  Filled 2022-10-05 (×5): qty 2.5

## 2022-10-05 MED ORDER — POTASSIUM CHLORIDE CRYS ER 20 MEQ PO TBCR
40.0000 meq | EXTENDED_RELEASE_TABLET | ORAL | Status: AC
  Administered 2022-10-05 (×2): 40 meq via ORAL
  Filled 2022-10-05 (×2): qty 2

## 2022-10-05 MED ORDER — DILTIAZEM LOAD VIA INFUSION
15.0000 mg | Freq: Once | INTRAVENOUS | Status: AC
  Administered 2022-10-05: 15 mg via INTRAVENOUS
  Filled 2022-10-05: qty 15

## 2022-10-05 NOTE — Consult Note (Signed)
ANTICOAGULATION CONSULT NOTE  Pharmacy Consult for Lovenox Indication: atrial fibrillation  Patient Measurements: Height: 5\' 5"  (165.1 cm) Weight: 67.6 kg (149 lb) IBW/kg (Calculated) : 57  Labs: Recent Labs    10/03/22 1420 10/04/22 0543 10/05/22 0507  HGB 12.0 10.8* 11.6*  HCT 34.8* 32.2* 35.0*  PLT 149* 160 191  CREATININE 0.51 0.42* 0.43*    Estimated Creatinine Clearance: 56.4 mL/min (A) (by C-G formula based on SCr of 0.43 mg/dL (L)).   Medical History: Past Medical History:  Diagnosis Date   Anemia    Anxiety    Hyperlipidemia    Hypertension     Medications:  No anticoagulation prior to admission per my chart review  Assessment: Patient is a 72 y/o F with medical history as above who was admitted 1/2 with Influenzal pneumonia / CAP. Admission complicated by new-onset Afib with RVR on 1/4 AM. Pharmacy consulted to initiate and monitor Lovenox for Afib.  CBC notable for stable, mild anemia. Scr 0.4 - 0.5.   Plan:  --Initiate Lovenox 67.5 mg (1 mg/kg) q12h --CBC / Scr per protocol  Benita Gutter 10/05/2022,10:05 AM

## 2022-10-05 NOTE — ED Notes (Signed)
Pt assisted with getting unhooked and hooked back up to monitoring equipment so she can use the bathroom- pt denies any other needs at this time

## 2022-10-05 NOTE — ED Notes (Signed)
Per Dr. Reesa Chew STOP DILTAZEM drip at this time due to patient in SR and BP WNL

## 2022-10-05 NOTE — ED Notes (Signed)
No improvement in HR - Dr. Reesa Chew at bedside.

## 2022-10-05 NOTE — Progress Notes (Signed)
PHARMACIST - PHYSICIAN COMMUNICATION DR:   Reesa Chew CONCERNING: Antibiotic IV to Oral Route Change Policy  RECOMMENDATION: This patient is receiving Azithromycin by the intravenous route.  Based on criteria approved by the Pharmacy and Therapeutics Committee, the antibiotic(s) is/are being converted to the equivalent oral dose form(s).   DESCRIPTION: These criteria include: Patient being treated for a respiratory tract infection, urinary tract infection, cellulitis or clostridium difficile associated diarrhea if on metronidazole The patient is not neutropenic and does not exhibit a GI malabsorption state The patient is eating (either orally or via tube) and/or has been taking other orally administered medications for a least 24 hours The patient is improving clinically and has a Tmax < 100.5  If you have questions about this conversion, please contact the Pharmacy Department  []   [phone] )  [facility] [x]   [phone] )  [facility] []   [phone] )  [facility] []   [phone] )  [facility] []   [phone] )  [facility]

## 2022-10-05 NOTE — ED Notes (Signed)
Patient with new tachycardia, EKG obtained and shows afib with heart rate 140s-160s. Pt denies shortness of breath or lightheadedness. No hx of afib reported by patient.  Stat paged Dr. Reesa Chew.

## 2022-10-05 NOTE — ED Notes (Signed)
Pt appears to have converted back in SR, HR 70s - obtained EKG paged Dr. Reesa Chew.

## 2022-10-05 NOTE — Progress Notes (Signed)
PROGRESS NOTE    SVARA TWYMAN  EXB:[id number] DOB: 09/13/1951 DOA: 10/03/2022 PCP: System, Provider Not In   Brief Narrative:   72 y.o. Caucasian female with medical history significant for hypertension, dyslipidemia and anxiety, who presented to the emergency room with a [place] of worsening cough with associated dyspnea and wheezing over the last week.  She had a fever that was up to 102.  Patient was found to be influenza A positive, COVID/RSV was negative.  Chest x-ray showed bilateral interstitial opacity.  Patient was started on Tamiflu, IV Rocephin and azithromycin.  Assessment & Plan:  Principal Problem:   Influenzal pneumonia Active Problems:   Community acquired pneumonia of left lower lobe of lung   Acute respiratory failure with hypoxia (HCC)   Hypertensive urgency   Hypokalemia   Dyslipidemia   GERD without esophagitis   Depression     Assessment and Plan: * Influenzal pneumonia - Started on 5 days of p.o. Tamiflu.  Supportive care.  Community acquired pneumonia of left lower lobe of lung - Likely causing hypoxia.  Procalcitonin 0.2.  Continue IV Rocephin and azithromycin.  Supportive care, bronchodilators.  I-S/flutter valve.  Acute respiratory failure with hypoxia (HCC) - This is clearly secondary to #1 and #2.  Tachycardiac- A fib with rvr. Paroxysmal.  - No prior history.  Started on Cardizem drip, resume p.o. Coreg.  IV Lopressor.  Heparin drip for anticoagulation.  Check TSH, echocardiogram.  Hypertensive urgency - Continue home meds.  IV as needed ordered  Hypokalemia/hypomagnesemia - Replete as needed  Depression - We will continue Wellbutrin XL and Prozac.  GERD without esophagitis - PPI  Dyslipidemia - Lipitor   Hypokalemia-repletion   DVT prophylaxis: Lovenox Code Status: DNI Family Communication: Daughter at bedside  Status is: Inpatient Continue hospital stay due to respiratory distress    Subjective: Right before my visit  patient went into atrial fibrillation with RVR.  No prior history of this.  Overall does have some coughing but does not feel any palpitations Examination: Constitutional: Not in acute distress Respiratory: Mild bilateral Cardiovascular: Tachycardia, irregularly irregular Abdomen: Nontender nondistended good bowel sounds Musculoskeletal: No edema noted Skin: No rashes seen Neurologic: CN 2-12 grossly intact.  And nonfocal Psychiatric: Normal judgment and insight. Alert and oriented x 3. Normal mood.  Objective: Vitals:   10/05/22 0700 10/05/22 0800 10/05/22 0819 10/05/22 0917  BP: (!) 175/74 (!) 193/77  (!) 161/98  Pulse: 82 90  (!) 147  Resp: 20 14  20   Temp: 98.7 F (37.1 C)     TempSrc: Oral     SpO2: 92% 92% 94% 92%  Weight:      Height:        Intake/Output Summary (Last 24 hours) at 10/05/2022 0932 Last data filed at 10/04/2022 1806 Gross per 24 hour  Intake 1000 ml  Output --  Net 1000 ml   Filed Weights   10/04/22 0544  Weight: 67.6 kg     Data Reviewed:   CBC: Recent Labs  Lab 10/03/22 1420 10/04/22 0543 10/05/22 0507  WBC 5.5 5.7 8.7  NEUTROABS 4.5  --   --   HGB 12.0 10.8* 11.6*  HCT 34.8* 32.2* 35.0*  MCV 81.7 83.4 84.3  PLT 149* 160 102   Basic Metabolic Panel: Recent Labs  Lab 10/03/22 1420 10/04/22 0543 10/05/22 0507  NA 130* 132* 133*  K 2.7* 3.9 3.1*  CL 87* 95* 94*  CO2 30 28 28   GLUCOSE 111* 114* 99  BUN  12 11 7*  CREATININE 0.51 0.42* 0.43*  CALCIUM 8.4* 7.7* 7.7*  MG 1.6*  --  1.9   GFR: Estimated Creatinine Clearance: 56.4 mL/min (A) (by C-G formula based on SCr of 0.43 mg/dL (L)). Liver Function Tests: No results for input(s): "AST", "ALT", "ALKPHOS", "BILITOT", "PROT", "ALBUMIN" in the last 168 hours. No results for input(s): "LIPASE", "AMYLASE" in the last 168 hours. No results for input(s): "AMMONIA" in the last 168 hours. Coagulation Profile: No results for input(s): "INR", "PROTIME" in the last 168 hours. Cardiac  Enzymes: No results for input(s): "CKTOTAL", "CKMB", "CKMBINDEX", "TROPONINI" in the last 168 hours. BNP (last 3 results) No results for input(s): "PROBNP" in the last 8760 hours. HbA1C: No results for input(s): "HGBA1C" in the last 72 hours. CBG: No results for input(s): "GLUCAP" in the last 168 hours. Lipid Profile: No results for input(s): "CHOL", "HDL", "LDLCALC", "TRIG", "CHOLHDL", "LDLDIRECT" in the last 72 hours. Thyroid Function Tests: No results for input(s): "TSH", "T4TOTAL", "FREET4", "T3FREE", "THYROIDAB" in the last 72 hours. Anemia Panel: No results for input(s): "VITAMINB12", "FOLATE", "FERRITIN", "TIBC", "IRON", "RETICCTPCT" in the last 72 hours. Sepsis Labs: Recent Labs  Lab 10/03/22 1420  PROCALCITON 0.21    Recent Results (from the past 240 hour(s))  Resp panel by RT-PCR (RSV, Flu A&B, Covid) Anterior Nasal Swab     Status: Abnormal   Collection Time: 10/03/22 11:52 AM   Specimen: Anterior Nasal Swab  Result Value Ref Range Status   SARS Coronavirus 2 by RT PCR NEGATIVE NEGATIVE Final    Comment: (NOTE) SARS-CoV-2 target nucleic acids are NOT DETECTED.  The SARS-CoV-2 RNA is generally detectable in upper respiratory specimens during the acute phase of infection. The lowest concentration of SARS-CoV-2 viral copies this assay can detect is 138 copies/mL. A negative result does not preclude SARS-Cov-2 infection and should not be used as the sole basis for treatment or other patient management decisions. A negative result may occur with  improper specimen collection/handling, submission of specimen other than nasopharyngeal swab, presence of viral mutation(s) within the areas targeted by this assay, and inadequate number of viral copies(<138 copies/mL). A negative result must be combined with clinical observations, patient history, and epidemiological information. The expected result is Negative.  Fact Sheet for Patients:   EntrepreneurPulse.com.au  Fact Sheet for Healthcare Providers:  IncredibleEmployment.be  This test is no t yet approved or cleared by the [place] FDA and  has been authorized for detection and/or diagnosis of SARS-CoV-2 by FDA under an Emergency Use Authorization (EUA). This EUA will remain  in effect (meaning this test can be used) for the duration of the COVID-19 declaration under Section 564(b)(1) of the Act, 21 U.S.C.section 360bbb-3(b)(1), unless the authorization is terminated  or revoked sooner.       Influenza A by PCR POSITIVE (A) NEGATIVE Final   Influenza B by PCR NEGATIVE NEGATIVE Final    Comment: (NOTE) The Xpert Xpress SARS-CoV-2/FLU/RSV plus assay is intended as an aid in the diagnosis of influenza from Nasopharyngeal swab specimens and should not be used as a sole basis for treatment. Nasal washings and aspirates are unacceptable for Xpert Xpress SARS-CoV-2/FLU/RSV testing.  Fact Sheet for Patients: EntrepreneurPulse.com.au  Fact Sheet for Healthcare Providers: IncredibleEmployment.be  This test is not yet approved or cleared by the [place] FDA and has been authorized for detection and/or diagnosis of SARS-CoV-2 by FDA under an Emergency Use Authorization (EUA). This EUA will remain in effect (meaning this test can be used) for  the duration of the COVID-19 declaration under Section 564(b)(1) of the Act, 21 U.S.C. section 360bbb-3(b)(1), unless the authorization is terminated or revoked.     Resp Syncytial Virus by PCR NEGATIVE NEGATIVE Final    Comment: (NOTE) Fact Sheet for Patients: BloggerCourse.com  Fact Sheet for Healthcare Providers: SeriousBroker.it  This test is not yet approved or cleared by the [place] FDA and has been authorized for detection and/or diagnosis of SARS-CoV-2 by FDA under an Emergency Use  Authorization (EUA). This EUA will remain in effect (meaning this test can be used) for the duration of the COVID-19 declaration under Section 564(b)(1) of the Act, 21 U.S.C. section 360bbb-3(b)(1), unless the authorization is terminated or revoked.  Performed at [facility], [address] [place], [place] [address]   Resp panel by RT-PCR (RSV, Flu A&B, Covid) Anterior Nasal Swab     Status: Abnormal   Collection Time: 10/03/22  9:07 PM   Specimen: Anterior Nasal Swab  Result Value Ref Range Status   SARS Coronavirus 2 by RT PCR NEGATIVE NEGATIVE Final    Comment: (NOTE) SARS-CoV-2 target nucleic acids are NOT DETECTED.  The SARS-CoV-2 RNA is generally detectable in upper respiratory specimens during the acute phase of infection. The lowest concentration of SARS-CoV-2 viral copies this assay can detect is 138 copies/mL. A negative result does not preclude SARS-Cov-2 infection and should not be used as the sole basis for treatment or other patient management decisions. A negative result may occur with  improper specimen collection/handling, submission of specimen other than nasopharyngeal swab, presence of viral mutation(s) within the areas targeted by this assay, and inadequate number of viral copies(<138 copies/mL). A negative result must be combined with clinical observations, patient history, and epidemiological information. The expected result is Negative.  Fact Sheet for Patients:  BloggerCourse.com  Fact Sheet for Healthcare Providers:  SeriousBroker.it  This test is no t yet approved or cleared by the [place] FDA and  has been authorized for detection and/or diagnosis of SARS-CoV-2 by FDA under an Emergency Use Authorization (EUA). This EUA will remain  in effect (meaning this test can be used) for the duration of the COVID-19 declaration under Section 564(b)(1) of the Act, 21 U.S.C.section  360bbb-3(b)(1), unless the authorization is terminated  or revoked sooner.       Influenza A by PCR POSITIVE (A) NEGATIVE Final   Influenza B by PCR NEGATIVE NEGATIVE Final    Comment: (NOTE) The Xpert Xpress SARS-CoV-2/FLU/RSV plus assay is intended as an aid in the diagnosis of influenza from Nasopharyngeal swab specimens and should not be used as a sole basis for treatment. Nasal washings and aspirates are unacceptable for Xpert Xpress SARS-CoV-2/FLU/RSV testing.  Fact Sheet for Patients: BloggerCourse.com  Fact Sheet for Healthcare Providers: SeriousBroker.it  This test is not yet approved or cleared by the [place] FDA and has been authorized for detection and/or diagnosis of SARS-CoV-2 by FDA under an Emergency Use Authorization (EUA). This EUA will remain in effect (meaning this test can be used) for the duration of the COVID-19 declaration under Section 564(b)(1) of the Act, 21 U.S.C. section 360bbb-3(b)(1), unless the authorization is terminated or revoked.     Resp Syncytial Virus by PCR NEGATIVE NEGATIVE Final    Comment: (NOTE) Fact Sheet for Patients: BloggerCourse.com  Fact Sheet for Healthcare Providers: SeriousBroker.it  This test is not yet approved or cleared by the [place] FDA and has been authorized for detection and/or diagnosis of SARS-CoV-2 by FDA  under an Emergency Use Authorization (EUA). This EUA will remain in effect (meaning this test can be used) for the duration of the COVID-19 declaration under Section 564(b)(1) of the Act, 21 U.S.C. section 360bbb-3(b)(1), unless the authorization is terminated or revoked.  Performed at [facility], [address] [place], [place] [address]          Radiology Studies: CT CHEST WO CONTRAST  Result Date: 10/03/2022 CLINICAL DATA:  Respiratory illness, nondiagnostic x-ray. Cough  with nasal congestion and fever for 1 week. EXAM: CT CHEST WITHOUT CONTRAST TECHNIQUE: Multidetector CT imaging of the chest was performed following the standard protocol without IV contrast. RADIATION DOSE REDUCTION: This exam was performed according to the departmental dose-optimization program which includes automated exposure control, adjustment of the mA and/or kV according to patient size and/or use of iterative reconstruction technique. COMPARISON:  Chest radiographs earlier the same date. No other comparison studies. FINDINGS: Cardiovascular: Minimal atherosclerosis of the aorta and great vessels. No acute vascular findings on noncontrast imaging. The heart size is normal. There is no pericardial effusion. Mediastinum/Nodes: There are no enlarged mediastinal, hilar or axillary lymph nodes.Hilar assessment is limited by the lack of intravenous contrast. Small hiatal hernia. The thyroid gland and trachea appear unremarkable. Lungs/Pleura: No pleural effusion or pneumothorax. There is diffuse central airway thickening with patchy airspace and ground-glass opacities throughout both lungs, most confluent in the superior segment of the right lower lobe, the right middle lobe, the inferior lingula and inferior left lower lobe. Upper abdomen: No significant findings are seen within the visualized upper abdomen. Musculoskeletal/Chest wall: There is no chest wall mass or suspicious osseous finding. Thoracolumbar scoliosis associated with mild spondylosis. IMPRESSION: 1. Patchy airspace and ground-glass opacities throughout both lungs as described, most consistent with multifocal pneumonia. Radiographic follow-up recommended to ensure resolution. 2. No evidence of pleural effusion or pneumothorax. 3. Small hiatal hernia. Electronically Signed   By: Carey Bullocks M.D.   On: 10/03/2022 17:00   DG Chest 2 View  Result Date: 10/03/2022 CLINICAL DATA:  Shortness of breath EXAM: CHEST - 2 VIEW COMPARISON:  None  FINDINGS: No pleural effusion. No pneumothorax. Normal cardiac and mediastinal contours. Hazy opacity at the left lung base represent atelectasis or infection in the right clinical setting. Foramina bilateral interstitial opacities, which can be seen in the setting of atypical infection. No displaced rib fractures. Visualized upper abdomen is unremarkable. IMPRESSION: Prominent bilateral interstitial opacities with a possible more focal opacity at the left lung base. Findings could be seen in the setting of atypical infection. Electronically Signed   By: Lorenza Cambridge M.D.   On: 10/03/2022 14:47        Scheduled Meds:  aspirin EC  81 mg Oral Daily   atorvastatin  10 mg Oral Daily   buPROPion  150 mg Oral QHS   carvedilol  25 mg Oral BID WC   cholecalciferol  2,000 Units Oral Daily   dextromethorphan-guaiFENesin  1 tablet Oral BID   enoxaparin (LOVENOX) injection  40 mg Subcutaneous Q24H   ferrous sulfate  325 mg Oral Q breakfast   FLUoxetine  40 mg Oral Daily   fluticasone  1 spray Each Nare QHS   irbesartan  300 mg Oral Daily   And   hydrochlorothiazide  25 mg Oral Daily   ipratropium-albuterol  3 mL Nebulization TID   multivitamin with minerals  1 tablet Oral Daily   oseltamivir  30 mg Oral BID   pantoprazole  40 mg Oral  Daily   Continuous Infusions:  sodium chloride 100 mL/hr at 10/05/22 0402   azithromycin Stopped (10/04/22 2315)   cefTRIAXone (ROCEPHIN)  IV Stopped (10/04/22 2202)     LOS: 2 days   Time spent= 35 mins    Avelardo Reesman Joline Maxcy, MD [facility]  If 7PM-7AM, please contact night-coverage  10/05/2022, 9:32 AM

## 2022-10-05 NOTE — ED Notes (Signed)
Pt reports she is feeling good and has no complaints. VSS at this time. Tolerating 3L [place] oxygen with saturations >95%

## 2022-10-06 ENCOUNTER — Inpatient Hospital Stay (HOSPITAL_COMMUNITY)
Admit: 2022-10-06 | Discharge: 2022-10-06 | Disposition: A | Discharge status: Home / Self Care | Payer: Medicare Other | Attending: Internal Medicine | Admitting: Internal Medicine

## 2022-10-06 DIAGNOSIS — I4891 Unspecified atrial fibrillation: Secondary | ICD-10-CM | POA: Diagnosis not present

## 2022-10-06 DIAGNOSIS — J1100 Influenza due to unidentified influenza virus with unspecified type of pneumonia: Secondary | ICD-10-CM | POA: Diagnosis not present

## 2022-10-06 LAB — ECHOCARDIOGRAM COMPLETE
AR max vel: 2.74 cm2
AV Area VTI: 2.98 cm2
AV Area mean vel: 3.28 cm2
AV Mean grad: 3 mmHg
AV Peak grad: 6.2 mmHg
Ao pk vel: 1.24 m/s
Area-P 1/2: 3.79 cm2
Height: 65 in
MV VTI: 2.17 cm2
S' Lateral: 2.8 cm
Weight: 2384 oz

## 2022-10-06 LAB — BASIC METABOLIC PANEL
Anion gap: 8 (ref 5–15)
BUN: 7 mg/dL — ABNORMAL LOW (ref 8–23)
CO2: 29 mmol/L (ref 22–32)
Calcium: 7.8 mg/dL — ABNORMAL LOW (ref 8.9–10.3)
Chloride: 96 mmol/L — ABNORMAL LOW (ref 98–111)
Creatinine, Ser: 0.38 mg/dL — ABNORMAL LOW (ref 0.44–1.00)
GFR, Estimated: >60 mL/min (ref >60)
Glucose, Bld: 110 mg/dL — ABNORMAL HIGH (ref 70–99)
Potassium: 3.3 mmol/L — ABNORMAL LOW (ref 3.5–5.1)
Sodium: 133 mmol/L — ABNORMAL LOW (ref 135–145)

## 2022-10-06 LAB — GLUCOSE, CAPILLARY: Glucose-Capillary: 110 mg/dL — ABNORMAL HIGH (ref 70–99)

## 2022-10-06 LAB — CBC
HCT: 32.6 % — ABNORMAL LOW (ref 36.0–46.0)
Hemoglobin: 11 g/dL — ABNORMAL LOW (ref 12.0–15.0)
MCH: 28.4 pg (ref 26.0–34.0)
MCHC: 33.7 g/dL (ref 30.0–36.0)
MCV: 84 fL (ref 80.0–100.0)
Platelets: 236 10*3/uL (ref 150–400)
RBC: 3.88 MIL/uL (ref 3.87–5.11)
RDW: 15 % (ref 11.5–15.5)
WBC: 9.7 10*3/uL (ref 4.0–10.5)
nRBC: 0 % (ref 0.0–0.2)

## 2022-10-06 LAB — MAGNESIUM: Magnesium: 1.9 mg/dL (ref 1.7–2.4)

## 2022-10-06 MED ORDER — AMLODIPINE BESYLATE 5 MG PO TABS
5.0000 mg | ORAL_TABLET | Freq: Every day | ORAL | Status: DC
  Administered 2022-10-06: 5 mg via ORAL
  Filled 2022-10-06: qty 1

## 2022-10-06 MED ORDER — POTASSIUM CHLORIDE 10 MEQ/100ML IV SOLN
10.0000 meq | INTRAVENOUS | Status: AC
  Administered 2022-10-06 (×2): 10 meq via INTRAVENOUS
  Filled 2022-10-06 (×2): qty 100

## 2022-10-06 MED ORDER — CEPHALEXIN 500 MG PO CAPS
500.0000 mg | ORAL_CAPSULE | Freq: Two times a day (BID) | ORAL | Status: DC
  Administered 2022-10-06 – 2022-10-07 (×3): 500 mg via ORAL
  Filled 2022-10-06 (×3): qty 1

## 2022-10-06 MED ORDER — POTASSIUM CHLORIDE 20 MEQ PO PACK
40.0000 meq | PACK | Freq: Once | ORAL | Status: AC
  Administered 2022-10-06: 40 meq via ORAL
  Filled 2022-10-06: qty 2

## 2022-10-06 NOTE — Progress Notes (Signed)
PROGRESS NOTE    Kimberly Hendrix  ZOX:[id number] DOB: 04-16-1951 DOA: 10/03/2022 PCP: System, Provider Not In   Brief Narrative:   72 y.o. Caucasian female with medical history significant for hypertension, dyslipidemia and anxiety, who presented to the emergency room with a [place] of worsening cough with associated dyspnea and wheezing over the last week.  She had a fever that was up to 102.  Patient was found to be influenza A positive, COVID/RSV was negative.  Chest x-ray showed bilateral interstitial opacity.  Patient was started on Tamiflu, IV Rocephin and azithromycin.  Hospital course was complicated by atrial fibrillation with RVR requiring IV Lopressor, Cardizem push and Cardizem drip.  Patient spontaneously converted to normal sinus rhythm.  She is still requiring 3 L of nasal cannula.  Assessment & Plan:  Principal Problem:   Influenzal pneumonia Active Problems:   Community acquired pneumonia of left lower lobe of lung   Acute respiratory failure with hypoxia (HCC)   Hypertensive urgency   Hypokalemia   Dyslipidemia   GERD without esophagitis   Depression     Assessment and Plan: * Influenzal pneumonia - Started on 5 days of p.o. Tamiflu.  Continue supportive care  Community acquired pneumonia of left lower lobe of lung - Likely causing hypoxia.  Procalcitonin 0.2.  Bronchodilators, I-S/flutter valve.  Now on oral Keflex and azithromycin, complete total 5-day course  Acute respiratory failure with hypoxia (HCC) - This is clearly secondary to #1 and #2.  Still requiring 3 L nasal cannula.  Not on any home oxygen.  Tachycardiac- A fib with rvr. Paroxysmal.  - No prior history.  Spontaneous converted to normal sinus rhythm.  Echocardiogram results pending.  TSH is normal.  Currently on Lovenox 1 mg/kg every 12 hours  Hypertensive urgency, uncontrolled - Continue home meds.  IV as needed ordered.  Added Norvasc 5 mg daily  Hypokalemia/hypomagnesemia - Replete as  needed  Depression - We will continue Wellbutrin XL and Prozac.  GERD without esophagitis - PPI  Dyslipidemia - Lipitor   Hypokalemia-repletion   DVT prophylaxis: Lovenox Code Status: DNI Family Communication: None at bedside  Status is: Inpatient Continue hospital stay due to respiratory distress    Subjective: Seen and examined at bedside.  Tells me her coughing is little better and able to take deeper breaths.  Still remains on 3 L nasal cannula.  On room air she desaturates down to 84%  Examination: Constitutional: Not in acute distress Respiratory: Diminished sounds bilaterally Cardiovascular: Normal sinus rhythm, no rubs Abdomen: Nontender nondistended good bowel sounds Musculoskeletal: No edema noted Skin: No rashes seen Neurologic: CN 2-12 grossly intact.  And nonfocal Psychiatric: Normal judgment and insight. Alert and oriented x 3. Normal mood.  Objective: Vitals:   10/06/22 0649 10/06/22 0800 10/06/22 0852 10/06/22 1000  BP: (!) 181/79 (!) 190/84 (!) 184/92   Pulse: 84 88 88   Resp: 15 20 (!) 29   Temp:    98.7 F (37.1 C)  TempSrc:    Oral  SpO2: 93% 95% 94%   Weight:      Height:        Intake/Output Summary (Last 24 hours) at 10/06/2022 1334 Last data filed at 10/06/2022 1119 Gross per 24 hour  Intake 1100 ml  Output --  Net 1100 ml   Filed Weights   10/04/22 0544  Weight: 67.6 kg     Data Reviewed:   CBC: Recent Labs  Lab 10/03/22 1420 10/04/22 0543 10/05/22 0507 10/06/22 8119  WBC 5.5 5.7 8.7 9.7  NEUTROABS 4.5  --   --   --   HGB 12.0 10.8* 11.6* 11.0*  HCT 34.8* 32.2* 35.0* 32.6*  MCV 81.7 83.4 84.3 84.0  PLT 149* 160 191 [id number]   Basic Metabolic Panel: Recent Labs  Lab 10/03/22 1420 10/04/22 0543 10/05/22 0507 10/06/22 0513  NA 130* 132* 133* 133*  K 2.7* 3.9 3.1* 3.3*  CL 87* 95* 94* 96*  CO2 30 28 28 29   GLUCOSE 111* 114* 99 110*  BUN 12 11 7* 7*  CREATININE 0.51 0.42* 0.43* 0.38*  CALCIUM 8.4* 7.7* 7.7* 7.8*   MG 1.6*  --  1.9 1.9   GFR: Estimated Creatinine Clearance: 56.4 mL/min (A) (by C-G formula based on SCr of 0.38 mg/dL (L)). Liver Function Tests: No results for input(s): "AST", "ALT", "ALKPHOS", "BILITOT", "PROT", "ALBUMIN" in the last 168 hours. No results for input(s): "LIPASE", "AMYLASE" in the last 168 hours. No results for input(s): "AMMONIA" in the last 168 hours. Coagulation Profile: No results for input(s): "INR", "PROTIME" in the last 168 hours. Cardiac Enzymes: No results for input(s): "CKTOTAL", "CKMB", "CKMBINDEX", "TROPONINI" in the last 168 hours. BNP (last 3 results) No results for input(s): "PROBNP" in the last 8760 hours. HbA1C: No results for input(s): "HGBA1C" in the last 72 hours. CBG: No results for input(s): "GLUCAP" in the last 168 hours. Lipid Profile: No results for input(s): "CHOL", "HDL", "LDLCALC", "TRIG", "CHOLHDL", "LDLDIRECT" in the last 72 hours. Thyroid Function Tests: Recent Labs    10/05/22 0506  TSH 0.180*   Anemia Panel: No results for input(s): "VITAMINB12", "FOLATE", "FERRITIN", "TIBC", "IRON", "RETICCTPCT" in the last 72 hours. Sepsis Labs: Recent Labs  Lab 10/03/22 1420  PROCALCITON 0.21    Recent Results (from the past 240 hour(s))  Resp panel by RT-PCR (RSV, Flu A&B, Covid) Anterior Nasal Swab     Status: Abnormal   Collection Time: 10/03/22 11:52 AM   Specimen: Anterior Nasal Swab  Result Value Ref Range Status   SARS Coronavirus 2 by RT PCR NEGATIVE NEGATIVE Final    Comment: (NOTE) SARS-CoV-2 target nucleic acids are NOT DETECTED.  The SARS-CoV-2 RNA is generally detectable in upper respiratory specimens during the acute phase of infection. The lowest concentration of SARS-CoV-2 viral copies this assay can detect is 138 copies/mL. A negative result does not preclude SARS-Cov-2 infection and should not be used as the sole basis for treatment or other patient management decisions. A negative result may occur with   improper specimen collection/handling, submission of specimen other than nasopharyngeal swab, presence of viral mutation(s) within the areas targeted by this assay, and inadequate number of viral copies(<138 copies/mL). A negative result must be combined with clinical observations, patient history, and epidemiological information. The expected result is Negative.  Fact Sheet for Patients:  EntrepreneurPulse.com.au  Fact Sheet for Healthcare Providers:  IncredibleEmployment.be  This test is no t yet approved or cleared by the [place] FDA and  has been authorized for detection and/or diagnosis of SARS-CoV-2 by FDA under an Emergency Use Authorization (EUA). This EUA will remain  in effect (meaning this test can be used) for the duration of the COVID-19 declaration under Section 564(b)(1) of the Act, 21 U.S.C.section 360bbb-3(b)(1), unless the authorization is terminated  or revoked sooner.       Influenza A by PCR POSITIVE (A) NEGATIVE Final   Influenza B by PCR NEGATIVE NEGATIVE Final    Comment: (NOTE) The Xpert Xpress SARS-CoV-2/FLU/RSV plus assay is intended  as an aid in the diagnosis of influenza from Nasopharyngeal swab specimens and should not be used as a sole basis for treatment. Nasal washings and aspirates are unacceptable for Xpert Xpress SARS-CoV-2/FLU/RSV testing.  Fact Sheet for Patients: EntrepreneurPulse.com.au  Fact Sheet for Healthcare Providers: IncredibleEmployment.be  This test is not yet approved or cleared by the [place] FDA and has been authorized for detection and/or diagnosis of SARS-CoV-2 by FDA under an Emergency Use Authorization (EUA). This EUA will remain in effect (meaning this test can be used) for the duration of the COVID-19 declaration under Section 564(b)(1) of the Act, 21 U.S.C. section 360bbb-3(b)(1), unless the authorization is terminated  or revoked.     Resp Syncytial Virus by PCR NEGATIVE NEGATIVE Final    Comment: (NOTE) Fact Sheet for Patients: EntrepreneurPulse.com.au  Fact Sheet for Healthcare Providers: IncredibleEmployment.be  This test is not yet approved or cleared by the [place] FDA and has been authorized for detection and/or diagnosis of SARS-CoV-2 by FDA under an Emergency Use Authorization (EUA). This EUA will remain in effect (meaning this test can be used) for the duration of the COVID-19 declaration under Section 564(b)(1) of the Act, 21 U.S.C. section 360bbb-3(b)(1), unless the authorization is terminated or revoked.  Performed at [facility], [address] [place], [place] [address]   Resp panel by RT-PCR (RSV, Flu A&B, Covid) Anterior Nasal Swab     Status: Abnormal   Collection Time: 10/03/22  9:07 PM   Specimen: Anterior Nasal Swab  Result Value Ref Range Status   SARS Coronavirus 2 by RT PCR NEGATIVE NEGATIVE Final    Comment: (NOTE) SARS-CoV-2 target nucleic acids are NOT DETECTED.  The SARS-CoV-2 RNA is generally detectable in upper respiratory specimens during the acute phase of infection. The lowest concentration of SARS-CoV-2 viral copies this assay can detect is 138 copies/mL. A negative result does not preclude SARS-Cov-2 infection and should not be used as the sole basis for treatment or other patient management decisions. A negative result may occur with  improper specimen collection/handling, submission of specimen other than nasopharyngeal swab, presence of viral mutation(s) within the areas targeted by this assay, and inadequate number of viral copies(<138 copies/mL). A negative result must be combined with clinical observations, patient history, and epidemiological information. The expected result is Negative.  Fact Sheet for Patients:  EntrepreneurPulse.com.au  Fact Sheet for Healthcare  Providers:  IncredibleEmployment.be  This test is no t yet approved or cleared by the [place] FDA and  has been authorized for detection and/or diagnosis of SARS-CoV-2 by FDA under an Emergency Use Authorization (EUA). This EUA will remain  in effect (meaning this test can be used) for the duration of the COVID-19 declaration under Section 564(b)(1) of the Act, 21 U.S.C.section 360bbb-3(b)(1), unless the authorization is terminated  or revoked sooner.       Influenza A by PCR POSITIVE (A) NEGATIVE Final   Influenza B by PCR NEGATIVE NEGATIVE Final    Comment: (NOTE) The Xpert Xpress SARS-CoV-2/FLU/RSV plus assay is intended as an aid in the diagnosis of influenza from Nasopharyngeal swab specimens and should not be used as a sole basis for treatment. Nasal washings and aspirates are unacceptable for Xpert Xpress SARS-CoV-2/FLU/RSV testing.  Fact Sheet for Patients: EntrepreneurPulse.com.au  Fact Sheet for Healthcare Providers: IncredibleEmployment.be  This test is not yet approved or cleared by the [place] FDA and has been authorized for detection and/or diagnosis of SARS-CoV-2 by FDA under an Emergency Use Authorization (  EUA). This EUA will remain in effect (meaning this test can be used) for the duration of the COVID-19 declaration under Section 564(b)(1) of the Act, 21 U.S.C. section 360bbb-3(b)(1), unless the authorization is terminated or revoked.     Resp Syncytial Virus by PCR NEGATIVE NEGATIVE Final    Comment: (NOTE) Fact Sheet for Patients: EntrepreneurPulse.com.au  Fact Sheet for Healthcare Providers: IncredibleEmployment.be  This test is not yet approved or cleared by the [place] FDA and has been authorized for detection and/or diagnosis of SARS-CoV-2 by FDA under an Emergency Use Authorization (EUA). This EUA will remain in effect (meaning this test  can be used) for the duration of the COVID-19 declaration under Section 564(b)(1) of the Act, 21 U.S.C. section 360bbb-3(b)(1), unless the authorization is terminated or revoked.  Performed at [facility], [address] [place], [place] [address]          Radiology Studies: No results found.      Scheduled Meds:  amLODipine  5 mg Oral Daily   aspirin EC  81 mg Oral Daily   atorvastatin  10 mg Oral Daily   azithromycin  500 mg Oral QHS   buPROPion  150 mg Oral QHS   carvedilol  25 mg Oral BID WC   cephALEXin  500 mg Oral Q12H   cholecalciferol  2,000 Units Oral Daily   dextromethorphan-guaiFENesin  1 tablet Oral BID   enoxaparin (LOVENOX) injection  1 mg/kg Subcutaneous Q12H   ferrous sulfate  325 mg Oral Q breakfast   FLUoxetine  40 mg Oral Daily   fluticasone  1 spray Each Nare QHS   irbesartan  300 mg Oral Daily   And   hydrochlorothiazide  25 mg Oral Daily   ipratropium  0.5 mg Nebulization BID   levalbuterol  1.25 mg Nebulization BID   multivitamin with minerals  1 tablet Oral Daily   oseltamivir  30 mg Oral BID   pantoprazole  40 mg Oral Daily   Continuous Infusions:     LOS: 3 days   Time spent= 35 mins    Arlington Sigmund Arsenio Loader, MD [facility]  If 7PM-7AM, please contact night-coverage  10/06/2022, 1:34 PM

## 2022-10-06 NOTE — Progress Notes (Signed)
*  PRELIMINARY RESULTS* Echocardiogram 2D Echocardiogram has been performed.  Kimberly Hendrix 10/06/2022, 12:21 PM

## 2022-10-06 NOTE — ED Notes (Signed)
Call to RN Janett Billow to let her know about elevated bp and meds given for the bp

## 2022-10-06 NOTE — ED Notes (Signed)
Call to RN, Janett Billow, gave report over phone.

## 2022-10-07 DIAGNOSIS — J1100 Influenza due to unidentified influenza virus with unspecified type of pneumonia: Secondary | ICD-10-CM | POA: Diagnosis not present

## 2022-10-07 LAB — CBC
HCT: 32.2 % — ABNORMAL LOW (ref 36.0–46.0)
Hemoglobin: 11.1 g/dL — ABNORMAL LOW (ref 12.0–15.0)
MCH: 28.2 pg (ref 26.0–34.0)
MCHC: 34.5 g/dL (ref 30.0–36.0)
MCV: 81.9 fL (ref 80.0–100.0)
Platelets: 284 10*3/uL (ref 150–400)
RBC: 3.93 MIL/uL (ref 3.87–5.11)
RDW: 14.8 % (ref 11.5–15.5)
WBC: 9.7 10*3/uL (ref 4.0–10.5)
nRBC: 0 % (ref 0.0–0.2)

## 2022-10-07 LAB — BASIC METABOLIC PANEL
Anion gap: 12 (ref 5–15)
BUN: 5 mg/dL — ABNORMAL LOW (ref 8–23)
CO2: 28 mmol/L (ref 22–32)
Calcium: 8.3 mg/dL — ABNORMAL LOW (ref 8.9–10.3)
Chloride: 92 mmol/L — ABNORMAL LOW (ref 98–111)
Creatinine, Ser: 0.38 mg/dL — ABNORMAL LOW (ref 0.44–1.00)
GFR, Estimated: >60 mL/min (ref >60)
Glucose, Bld: 103 mg/dL — ABNORMAL HIGH (ref 70–99)
Potassium: 3 mmol/L — ABNORMAL LOW (ref 3.5–5.1)
Sodium: 132 mmol/L — ABNORMAL LOW (ref 135–145)

## 2022-10-07 LAB — MAGNESIUM: Magnesium: 1.8 mg/dL (ref 1.7–2.4)

## 2022-10-07 LAB — T4, FREE: Free T4: 1.68 ng/dL — ABNORMAL HIGH (ref 0.61–1.12)

## 2022-10-07 LAB — PHOSPHORUS: Phosphorus: 2.7 mg/dL (ref 2.5–4.6)

## 2022-10-07 MED ORDER — AMLODIPINE BESYLATE 10 MG PO TABS
10.0000 mg | ORAL_TABLET | Freq: Every day | ORAL | Status: DC
  Administered 2022-10-07: 10 mg via ORAL
  Filled 2022-10-07: qty 1

## 2022-10-07 MED ORDER — MAGNESIUM OXIDE -MG SUPPLEMENT 400 (240 MG) MG PO TABS
800.0000 mg | ORAL_TABLET | Freq: Once | ORAL | Status: AC
  Administered 2022-10-07: 800 mg via ORAL
  Filled 2022-10-07: qty 2

## 2022-10-07 MED ORDER — CARVEDILOL 25 MG PO TABS: 37.5000 mg | ORAL_TABLET | Freq: Two times a day (BID) | ORAL | Status: DC

## 2022-10-07 MED ORDER — AZITHROMYCIN 250 MG PO TABS: 250.0000 mg | ORAL_TABLET | Freq: Every day | ORAL | 0 refills | Status: AC

## 2022-10-07 MED ORDER — POTASSIUM CHLORIDE CRYS ER 20 MEQ PO TBCR
40.0000 meq | EXTENDED_RELEASE_TABLET | Freq: Once | ORAL | Status: AC
  Administered 2022-10-07: 40 meq via ORAL
  Filled 2022-10-07: qty 2

## 2022-10-07 MED ORDER — AMLODIPINE BESYLATE 10 MG PO TABS: 10.0000 mg | ORAL_TABLET | Freq: Every day | ORAL | 0 refills | Status: DC

## 2022-10-07 MED ORDER — POTASSIUM CHLORIDE CRYS ER 20 MEQ PO TBCR: 40.0000 meq | EXTENDED_RELEASE_TABLET | Freq: Every day | ORAL | 0 refills | Status: DC

## 2022-10-07 MED ORDER — CEPHALEXIN 500 MG PO CAPS: 500.0000 mg | ORAL_CAPSULE | Freq: Two times a day (BID) | ORAL | 0 refills | Status: AC

## 2022-10-07 MED ORDER — OSELTAMIVIR PHOSPHATE 30 MG PO CAPS: 30.0000 mg | ORAL_CAPSULE | Freq: Two times a day (BID) | ORAL | 0 refills | Status: AC

## 2022-10-07 MED ORDER — ALBUTEROL SULFATE HFA 108 (90 BASE) MCG/ACT IN AERS: 2.0000 | INHALATION_SPRAY | Freq: Four times a day (QID) | RESPIRATORY_TRACT | 2 refills | Status: DC | PRN

## 2022-10-07 MED ORDER — APIXABAN 5 MG PO TABS
5.0000 mg | ORAL_TABLET | Freq: Two times a day (BID) | ORAL | Status: DC
  Administered 2022-10-07: 5 mg via ORAL
  Filled 2022-10-07: qty 1

## 2022-10-07 MED ORDER — APIXABAN 5 MG PO TABS: 5.0000 mg | ORAL_TABLET | Freq: Two times a day (BID) | ORAL | 0 refills | Status: AC

## 2022-10-07 MED ORDER — CARVEDILOL 12.5 MG PO TABS: 37.5000 mg | ORAL_TABLET | Freq: Two times a day (BID) | ORAL | 0 refills | Status: AC

## 2022-10-07 MED ORDER — POTASSIUM CHLORIDE 10 MEQ/100ML IV SOLN
10.0000 meq | INTRAVENOUS | Status: DC
  Filled 2022-10-07 (×4): qty 100

## 2022-10-07 NOTE — Discharge Summary (Signed)
Physician Discharge Summary  BENEDICTA SULTAN TGG:[id number] DOB: [birth year] DOA: 10/03/2022  PCP: System, Provider Not In  Admit date: 10/03/2022 Discharge date: 10/07/2022  Admitted From: Home Disposition: Home  Recommendations for Outpatient Follow-up:  Follow up with PCP in 1-2 weeks Please obtain BMP/CBC in one week your next doctors visit.  Albuterol inhaler as needed prescribed.  Advised to complete course of Keflex, azithromycin and Tamiflu.  Prescriptions provided. Advised to take 2 more tablets of potassium supplements at home. Eliquis twice daily prescribed for A-fib episode in the hospital.  Also cardiology referral made.  Spoke with [facility] nurse practitioner to help assist with cardiology referral outpatient Coreg increased to 37.5 mg twice daily, Norvasc 10 mg orally added Repeat thyroid studies outpatient with PCP in next 3-4 weeks  Discharge Condition: Stable CODE STATUS: Full code Diet recommendation: Healthy  Brief/Interim Summary: 72 y.o. Caucasian female with medical history significant for hypertension, dyslipidemia and anxiety, who presented to the emergency room with a [place] of worsening cough with associated dyspnea and wheezing over the last week.  She had a fever that was up to 102.  Patient was found to be influenza A positive, COVID/RSV was negative.  Chest x-ray showed bilateral interstitial opacity.  Patient was started on Tamiflu, IV Rocephin and azithromycin.  Hospital course was complicated by atrial fibrillation with RVR requiring IV Lopressor, Cardizem push and Cardizem drip.  Patient spontaneously converted to normal sinus rhythm.  She is still requiring 3 L of nasal cannula.  Over the course of several days her breathing symptoms improved.  She was started on anticoagulation for atrial fibrillation, outpatient cardiology referral made. Today doing significantly better.  Follow-up outpatient with PCP.   Assessment & Plan:  Principal Problem:   Influenzal  pneumonia Active Problems:   Community acquired pneumonia of left lower lobe of lung   Acute respiratory failure with hypoxia (HCC)   Hypertensive urgency   Hypokalemia   Dyslipidemia   GERD without esophagitis   Depression       Assessment and Plan: * Influenzal pneumonia - Complete 2 more days of outpatient Tamiflu.  Albuterol prescription given.  Supportive care   Community acquired pneumonia of left lower lobe of lung - Likely causing hypoxia.  Procalcitonin 0.2.  Bronchodilators, I-S/flutter valve.  Now on oral Keflex and azithromycin, complete total 5-day course, 2 more days of prescription given upon discharge.   Acute respiratory failure with hypoxia (HCC) - This morning she is off oxygen saturating 95% on room air.  Tachycardiac- A fib with rvr. Paroxysmal.  - No prior history.  Spontaneous converted to normal sinus rhythm.  Echocardiogram shows preserved EF with elevated PASP and moderate MR.  Currently she is in normal sinus rhythm.  Eliquis twice daily added.  Outpatient cardiology referral made.   Hypertensive urgency, improved  - Continue home meds.  Increased Coreg to 37.5 mg twice daily.  Added Norvasc 10 mg daily.   Hypokalemia/hypomagnesemia - Replete as needed.  2 more days of oral potassium at home.  Repeat outpatient with PCP   Depression - We will continue Wellbutrin XL and Prozac.   GERD without esophagitis - PPI   Dyslipidemia - Lipitor   Slightly low TSH, elevated free T4-recommend repeating this with PCP outpatient in next 2-4 weeks.  Further workup depending on repeat results     Discharge Diagnoses:  Principal Problem:   Influenzal pneumonia Active Problems:   Community acquired pneumonia of left lower lobe of lung   Acute respiratory  failure with hypoxia (HCC)   Hypertensive urgency   Hypokalemia   Dyslipidemia   GERD without esophagitis   Depression      Consultations: None  Subjective: Feels greater oxygen saturation  remains greater than 93% on room air.  She would like to go home.  Discharge Exam: Vitals:   10/07/22 0410 10/07/22 0735  BP: (!) 177/76 (!) 176/71  Pulse: 94 92  Resp: (!) 24 20  Temp: 98.1 F (36.7 C)   SpO2: 96% 99%   Vitals:   10/07/22 0018 10/07/22 0229 10/07/22 0410 10/07/22 0735  BP: (!) 198/83 (!) 181/85 (!) 177/76 (!) 176/71  Pulse: 88 84 94 92  Resp: 18 20 (!) 24 20  Temp: 98.2 F (36.8 C)  98.1 F (36.7 C)   TempSrc: Oral  Oral   SpO2: 95% 94% 96% 99%  Weight:      Height:        General: Pt is alert, awake, not in acute distress Cardiovascular: RRR, S1/S2 +, no rubs, no gallops Respiratory: CTA bilaterally, no wheezing, no rhonchi Abdominal: Soft, NT, ND, bowel sounds + Extremities: no edema, no cyanosis  Discharge Instructions  Discharge Instructions     Ambulatory referral to Cardiology   Complete by: As directed    New Onset A fib with rvr in the hospital      Allergies as of 10/07/2022       Reactions   Penicillins    "foot turned red" Has patient had a PCN reaction causing immediate rash, facial/tongue/throat swelling, SOB or lightheadedness with hypotension: No Has patient had a PCN reaction causing severe rash involving mucus membranes or skin necrosis: No Has patient had a PCN reaction that required hospitalization: No Has patient had a PCN reaction occurring within the last 10 years: No If all of the above answers are "NO", then may proceed with Cephalosporin use.        Medication List     STOP taking these medications    benazepril-hydrochlorthiazide 20-12.5 MG tablet Commonly known as: LOTENSIN HCT   ibuprofen 200 MG tablet Commonly known as: ADVIL       TAKE these medications    albuterol 108 (90 Base) MCG/ACT inhaler Commonly known as: VENTOLIN HFA Inhale 2 puffs into the lungs every 6 (six) hours as needed for wheezing or shortness of breath.   amLODipine 10 MG tablet Commonly known as: NORVASC Take 1 tablet (10 mg  total) by mouth daily. Start taking on: October 08, 2022   apixaban 5 MG Tabs tablet Commonly known as: ELIQUIS Take 1 tablet (5 mg total) by mouth 2 (two) times daily.   aspirin EC 81 MG tablet Take 81 mg by mouth daily.   atorvastatin 10 MG tablet Commonly known as: LIPITOR Take 10 mg by mouth daily.   azithromycin 250 MG tablet Commonly known as: ZITHROMAX Take 1 tablet (250 mg total) by mouth daily for 2 days.   benzonatate 200 MG capsule Commonly known as: TESSALON Take 200 mg by mouth 3 (three) times daily as needed.   buPROPion 150 MG 24 hr tablet Commonly known as: WELLBUTRIN XL Take 150 mg by mouth at bedtime.   carvedilol 12.5 MG tablet Commonly known as: COREG Take 3 tablets (37.5 mg total) by mouth 2 (two) times daily with a meal. What changed:  medication strength how much to take   CENTRUM SILVER 50+WOMEN PO Take 1 tablet by mouth daily.   cephALEXin 500 MG capsule Commonly known as:  KEFLEX Take 1 capsule (500 mg total) by mouth every 12 (twelve) hours for 2 days.   diphenhydrAMINE 25 mg capsule Commonly known as: BENADRYL Take 25 mg by mouth daily as needed for allergies.   ferrous sulfate 325 (65 FE) MG tablet Take 325 mg by mouth daily with breakfast.   FLUoxetine 40 MG capsule Commonly known as: PROZAC Take 40 mg by mouth daily.   fluticasone 50 MCG/ACT nasal spray Commonly known as: FLONASE Place 1 spray into both nostrils at bedtime.   olmesartan-hydrochlorothiazide 40-25 MG tablet Commonly known as: BENICAR HCT Take 1 tablet by mouth daily.   omeprazole 40 MG capsule Commonly known as: PRILOSEC Take 40 mg by mouth daily.   oseltamivir 30 MG capsule Commonly known as: TAMIFLU Take 1 capsule (30 mg total) by mouth 2 (two) times daily for 2 days.   potassium chloride SA 20 MEQ tablet Commonly known as: KLOR-CON M Take 2 tablets (40 mEq total) by mouth daily for 2 days.   tretinoin 0.025 % cream Commonly known as: RETIN-A Apply  1 Application topically at bedtime.   Vitamin D3 50 MCG (2000 UT) Tabs Take 2,000 Units by mouth daily.        Follow-up Information     Primary Doctor. Schedule an appointment as soon as possible for a visit in 1 week(s).                 Allergies  Allergen Reactions   Penicillins     "foot turned red" Has patient had a PCN reaction causing immediate rash, facial/tongue/throat swelling, SOB or lightheadedness with hypotension: No Has patient had a PCN reaction causing severe rash involving mucus membranes or skin necrosis: No Has patient had a PCN reaction that required hospitalization: No Has patient had a PCN reaction occurring within the last 10 years: No If all of the above answers are "NO", then may proceed with Cephalosporin use.     You were cared for by a hospitalist during your hospital stay. If you have any questions about your discharge medications or the care you received while you were in the hospital after you are discharged, you can call the unit and asked to speak with the hospitalist on call if the hospitalist that took care of you is not available. Once you are discharged, your primary care physician will handle any further medical issues. Please note that no refills for any discharge medications will be authorized once you are discharged, as it is imperative that you return to your primary care physician (or establish a relationship with a primary care physician if you do not have one) for your aftercare needs so that they can reassess your need for medications and monitor your lab values.   Procedures/Studies: ECHOCARDIOGRAM COMPLETE  Result Date: 10/06/2022    ECHOCARDIOGRAM REPORT   Patient Name:   CHENELL LOZON Date of Exam: 10/06/2022 Medical Rec #:  [id number]     Height:       65.0 in Accession #:    [id number]    Weight:       149.0 lb Date of Birth:  1950/10/18     BSA:          1.746 m Patient Age:    71 years      BP:           184/92 mmHg Patient  Gender: F             HR:  76 bpm. Exam Location:  [facility] Procedure: 2D Echo, Cardiac Doppler and Color Doppler Indications:     Atrial Fibrillation  History:         Patient has no prior history of Echocardiogram examinations.                  Risk Factors:Hypertension and Dyslipidemia. Flu +.  Sonographer:     Mikki Harbor Referring Phys:  [id number] Loura Halt Youlanda Tomassetti Diagnosing Phys: Julien Nordmann MD IMPRESSIONS  1. Left ventricular ejection fraction, by estimation, is 60 to 65%. The left ventricle has normal function. The left ventricle has no regional wall motion abnormalities. Left ventricular diastolic parameters were normal.  2. Right ventricular systolic function is normal. The right ventricular size is normal. There is moderately elevated pulmonary artery systolic pressure.  3. Left atrial size was mildly dilated.  4. The mitral valve is normal in structure. Mild to moderate mitral valve regurgitation. No evidence of mitral stenosis.  5. Tricuspid valve regurgitation is moderate to severe.  6. The aortic valve is normal in structure. Aortic valve regurgitation is not visualized. No aortic stenosis is present.  7. The inferior vena cava is dilated in size with >50% respiratory variability, suggesting right atrial pressure of 8 mmHg. FINDINGS  Left Ventricle: Left ventricular ejection fraction, by estimation, is 60 to 65%. The left ventricle has normal function. The left ventricle has no regional wall motion abnormalities. The left ventricular internal cavity size was normal in size. There is  no left ventricular hypertrophy. Left ventricular diastolic parameters were normal. Right Ventricle: The right ventricular size is normal. No increase in right ventricular wall thickness. Right ventricular systolic function is normal. There is moderately elevated pulmonary artery systolic pressure. The tricuspid regurgitant velocity is 3.53 m/s, and with an assumed right atrial pressure of 8 mmHg, the  estimated right ventricular systolic pressure is 57.8 mmHg. Left Atrium: Left atrial size was mildly dilated. Right Atrium: Right atrial size was normal in size. Pericardium: There is no evidence of pericardial effusion. Mitral Valve: The mitral valve is normal in structure. Mild to moderate mitral valve regurgitation. No evidence of mitral valve stenosis. MV peak gradient, 9.9 mmHg. The mean mitral valve gradient is 4.0 mmHg. Tricuspid Valve: The tricuspid valve is normal in structure. Tricuspid valve regurgitation is moderate to severe. No evidence of tricuspid stenosis. Aortic Valve: The aortic valve is normal in structure. Aortic valve regurgitation is not visualized. No aortic stenosis is present. Aortic valve mean gradient measures 3.0 mmHg. Aortic valve peak gradient measures 6.2 mmHg. Aortic valve area, by VTI measures 2.98 cm. Pulmonic Valve: The pulmonic valve was normal in structure. Pulmonic valve regurgitation is mild. No evidence of pulmonic stenosis. Aorta: The aortic root is normal in size and structure. Venous: The inferior vena cava is dilated in size with greater than 50% respiratory variability, suggesting right atrial pressure of 8 mmHg. IAS/Shunts: No atrial level shunt detected by color flow Doppler.  LEFT VENTRICLE PLAX 2D LVIDd:         4.70 cm   Diastology LVIDs:         2.80 cm   LV e' medial:    9.79 cm/s LV PW:         1.10 cm   LV E/e' medial:  13.8 LV IVS:        1.20 cm   LV e' lateral:   14.40 cm/s LVOT diam:     2.00 cm   LV E/e' lateral: 9.4  LV SV:         82 LV SV Index:   47 LVOT Area:     3.14 cm  RIGHT VENTRICLE RV Basal diam:  3.25 cm RV Mid diam:    2.80 cm RV S prime:     13.50 cm/s TAPSE (M-mode): 3.0 cm LEFT ATRIUM             Index        RIGHT ATRIUM           Index LA diam:        4.70 cm 2.69 cm/m   RA Area:     17.20 cm LA Vol (A2C):   53.3 ml 30.54 ml/m  RA Volume:   45.00 ml  25.78 ml/m LA Vol (A4C):   62.4 ml 35.75 ml/m LA Biplane Vol: 61.5 ml 35.23 ml/m   AORTIC VALVE                    PULMONIC VALVE AV Area (Vmax):    2.74 cm     PV Vmax:       1.01 m/s AV Area (Vmean):   3.28 cm     PV Peak grad:  4.1 mmHg AV Area (VTI):     2.98 cm AV Vmax:           124.00 cm/s AV Vmean:          71.400 cm/s AV VTI:            0.275 m AV Peak Grad:      6.2 mmHg AV Mean Grad:      3.0 mmHg LVOT Vmax:         108.00 cm/s LVOT Vmean:        74.500 cm/s LVOT VTI:          0.261 m LVOT/AV VTI ratio: 0.95  AORTA Ao Root diam: 3.10 cm MITRAL VALVE                TRICUSPID VALVE MV Area (PHT): 3.79 cm     TR Peak grad:   49.8 mmHg MV Area VTI:   2.17 cm     TR Vmax:        353.00 cm/s MV Peak grad:  9.9 mmHg MV Mean grad:  4.0 mmHg     SHUNTS MV Vmax:       1.57 m/s     Systemic VTI:  0.26 m MV Vmean:      87.4 cm/s    Systemic Diam: 2.00 cm MV Decel Time: 200 msec MV E velocity: 135.00 cm/s MV A velocity: 99.00 cm/s MV E/A ratio:  1.36 Julien Nordmannimothy Gollan MD Electronically signed by Julien Nordmannimothy Gollan MD Signature Date/Time: 10/06/2022/2:13:33 PM    Final    CT CHEST WO CONTRAST  Result Date: 10/03/2022 CLINICAL DATA:  Respiratory illness, nondiagnostic x-ray. Cough with nasal congestion and fever for 1 week. EXAM: CT CHEST WITHOUT CONTRAST TECHNIQUE: Multidetector CT imaging of the chest was performed following the standard protocol without IV contrast. RADIATION DOSE REDUCTION: This exam was performed according to the departmental dose-optimization program which includes automated exposure control, adjustment of the mA and/or kV according to patient size and/or use of iterative reconstruction technique. COMPARISON:  Chest radiographs earlier the same date. No other comparison studies. FINDINGS: Cardiovascular: Minimal atherosclerosis of the aorta and great vessels. No acute vascular findings on noncontrast imaging. The heart size is normal. There is no pericardial effusion. Mediastinum/Nodes: There are  no enlarged mediastinal, hilar or axillary lymph nodes.Hilar assessment is limited  by the lack of intravenous contrast. Small hiatal hernia. The thyroid gland and trachea appear unremarkable. Lungs/Pleura: No pleural effusion or pneumothorax. There is diffuse central airway thickening with patchy airspace and ground-glass opacities throughout both lungs, most confluent in the superior segment of the right lower lobe, the right middle lobe, the inferior lingula and inferior left lower lobe. Upper abdomen: No significant findings are seen within the visualized upper abdomen. Musculoskeletal/Chest wall: There is no chest wall mass or suspicious osseous finding. Thoracolumbar scoliosis associated with mild spondylosis. IMPRESSION: 1. Patchy airspace and ground-glass opacities throughout both lungs as described, most consistent with multifocal pneumonia. Radiographic follow-up recommended to ensure resolution. 2. No evidence of pleural effusion or pneumothorax. 3. Small hiatal hernia. Electronically Signed   By: Carey Bullocks M.D.   On: 10/03/2022 17:00   DG Chest 2 View  Result Date: 10/03/2022 CLINICAL DATA:  Shortness of breath EXAM: CHEST - 2 VIEW COMPARISON:  None FINDINGS: No pleural effusion. No pneumothorax. Normal cardiac and mediastinal contours. Hazy opacity at the left lung base represent atelectasis or infection in the right clinical setting. Foramina bilateral interstitial opacities, which can be seen in the setting of atypical infection. No displaced rib fractures. Visualized upper abdomen is unremarkable. IMPRESSION: Prominent bilateral interstitial opacities with a possible more focal opacity at the left lung base. Findings could be seen in the setting of atypical infection. Electronically Signed   By: Lorenza Cambridge M.D.   On: 10/03/2022 14:47     The results of significant diagnostics from this hospitalization (including imaging, microbiology, ancillary and laboratory) are listed below for reference.     Microbiology: Recent Results (from the past 240 hour(s))  Resp panel  by RT-PCR (RSV, Flu A&B, Covid) Anterior Nasal Swab     Status: Abnormal   Collection Time: 10/03/22 11:52 AM   Specimen: Anterior Nasal Swab  Result Value Ref Range Status   SARS Coronavirus 2 by RT PCR NEGATIVE NEGATIVE Final    Comment: (NOTE) SARS-CoV-2 target nucleic acids are NOT DETECTED.  The SARS-CoV-2 RNA is generally detectable in upper respiratory specimens during the acute phase of infection. The lowest concentration of SARS-CoV-2 viral copies this assay can detect is 138 copies/mL. A negative result does not preclude SARS-Cov-2 infection and should not be used as the sole basis for treatment or other patient management decisions. A negative result may occur with  improper specimen collection/handling, submission of specimen other than nasopharyngeal swab, presence of viral mutation(s) within the areas targeted by this assay, and inadequate number of viral copies(<138 copies/mL). A negative result must be combined with clinical observations, patient history, and epidemiological information. The expected result is Negative.  Fact Sheet for Patients:  BloggerCourse.com  Fact Sheet for Healthcare Providers:  SeriousBroker.it  This test is no t yet approved or cleared by the [place] FDA and  has been authorized for detection and/or diagnosis of SARS-CoV-2 by FDA under an Emergency Use Authorization (EUA). This EUA will remain  in effect (meaning this test can be used) for the duration of the COVID-19 declaration under Section 564(b)(1) of the Act, 21 U.S.C.section 360bbb-3(b)(1), unless the authorization is terminated  or revoked sooner.       Influenza A by PCR POSITIVE (A) NEGATIVE Final   Influenza B by PCR NEGATIVE NEGATIVE Final    Comment: (NOTE) The Xpert Xpress SARS-CoV-2/FLU/RSV plus assay is intended as an aid in the diagnosis of  influenza from Nasopharyngeal swab specimens and should not be used as a  sole basis for treatment. Nasal washings and aspirates are unacceptable for Xpert Xpress SARS-CoV-2/FLU/RSV testing.  Fact Sheet for Patients: BloggerCourse.com  Fact Sheet for Healthcare Providers: SeriousBroker.it  This test is not yet approved or cleared by the [place] FDA and has been authorized for detection and/or diagnosis of SARS-CoV-2 by FDA under an Emergency Use Authorization (EUA). This EUA will remain in effect (meaning this test can be used) for the duration of the COVID-19 declaration under Section 564(b)(1) of the Act, 21 U.S.C. section 360bbb-3(b)(1), unless the authorization is terminated or revoked.     Resp Syncytial Virus by PCR NEGATIVE NEGATIVE Final    Comment: (NOTE) Fact Sheet for Patients: BloggerCourse.com  Fact Sheet for Healthcare Providers: SeriousBroker.it  This test is not yet approved or cleared by the [place] FDA and has been authorized for detection and/or diagnosis of SARS-CoV-2 by FDA under an Emergency Use Authorization (EUA). This EUA will remain in effect (meaning this test can be used) for the duration of the COVID-19 declaration under Section 564(b)(1) of the Act, 21 U.S.C. section 360bbb-3(b)(1), unless the authorization is terminated or revoked.  Performed at [facility], [address] [place], [place] [address]   Resp panel by RT-PCR (RSV, Flu A&B, Covid) Anterior Nasal Swab     Status: Abnormal   Collection Time: 10/03/22  9:07 PM   Specimen: Anterior Nasal Swab  Result Value Ref Range Status   SARS Coronavirus 2 by RT PCR NEGATIVE NEGATIVE Final    Comment: (NOTE) SARS-CoV-2 target nucleic acids are NOT DETECTED.  The SARS-CoV-2 RNA is generally detectable in upper respiratory specimens during the acute phase of infection. The lowest concentration of SARS-CoV-2 viral copies this assay can detect  is 138 copies/mL. A negative result does not preclude SARS-Cov-2 infection and should not be used as the sole basis for treatment or other patient management decisions. A negative result may occur with  improper specimen collection/handling, submission of specimen other than nasopharyngeal swab, presence of viral mutation(s) within the areas targeted by this assay, and inadequate number of viral copies(<138 copies/mL). A negative result must be combined with clinical observations, patient history, and epidemiological information. The expected result is Negative.  Fact Sheet for Patients:  BloggerCourse.com  Fact Sheet for Healthcare Providers:  SeriousBroker.it  This test is no t yet approved or cleared by the [place] FDA and  has been authorized for detection and/or diagnosis of SARS-CoV-2 by FDA under an Emergency Use Authorization (EUA). This EUA will remain  in effect (meaning this test can be used) for the duration of the COVID-19 declaration under Section 564(b)(1) of the Act, 21 U.S.C.section 360bbb-3(b)(1), unless the authorization is terminated  or revoked sooner.       Influenza A by PCR POSITIVE (A) NEGATIVE Final   Influenza B by PCR NEGATIVE NEGATIVE Final    Comment: (NOTE) The Xpert Xpress SARS-CoV-2/FLU/RSV plus assay is intended as an aid in the diagnosis of influenza from Nasopharyngeal swab specimens and should not be used as a sole basis for treatment. Nasal washings and aspirates are unacceptable for Xpert Xpress SARS-CoV-2/FLU/RSV testing.  Fact Sheet for Patients: BloggerCourse.com  Fact Sheet for Healthcare Providers: SeriousBroker.it  This test is not yet approved or cleared by the [place] FDA and has been authorized for detection and/or diagnosis of SARS-CoV-2 by FDA under an Emergency Use Authorization (EUA). This EUA will remain in  effect (meaning this test can be used) for the duration of the COVID-19 declaration under Section 564(b)(1) of the Act, 21 U.S.C. section 360bbb-3(b)(1), unless the authorization is terminated or revoked.     Resp Syncytial Virus by PCR NEGATIVE NEGATIVE Final    Comment: (NOTE) Fact Sheet for Patients: BloggerCourse.comhttps://www.fda.gov/media/152166/download  Fact Sheet for Healthcare Providers: SeriousBroker.ithttps://www.fda.gov/media/152162/download  This test is not yet approved or cleared by the [place] FDA and has been authorized for detection and/or diagnosis of SARS-CoV-2 by FDA under an Emergency Use Authorization (EUA). This EUA will remain in effect (meaning this test can be used) for the duration of the COVID-19 declaration under Section 564(b)(1) of the Act, 21 U.S.C. section 360bbb-3(b)(1), unless the authorization is terminated or revoked.  Performed at [facility], [address] [place], [place] [address]      Labs: BNP (last 3 results) Recent Labs    10/03/22 1420 10/04/22 0543  BNP 324.0* 317.5*   Basic Metabolic Panel: Recent Labs  Lab 10/03/22 1420 10/04/22 0543 10/05/22 0507 10/06/22 0513 10/07/22 0531 10/07/22 0536  NA 130* 132* 133* 133*  --  132*  K 2.7* 3.9 3.1* 3.3*  --  3.0*  CL 87* 95* 94* 96*  --  92*  CO2 30 28 28 29   --  28  GLUCOSE 111* 114* 99 110*  --  103*  BUN 12 11 7* 7*  --  5*  CREATININE 0.51 0.42* 0.43* 0.38*  --  0.38*  CALCIUM 8.4* 7.7* 7.7* 7.8*  --  8.3*  MG 1.6*  --  1.9 1.9  --  1.8  PHOS  --   --   --   --  2.7  --    Liver Function Tests: No results for input(s): "AST", "ALT", "ALKPHOS", "BILITOT", "PROT", "ALBUMIN" in the last 168 hours. No results for input(s): "LIPASE", "AMYLASE" in the last 168 hours. No results for input(s): "AMMONIA" in the last 168 hours. CBC: Recent Labs  Lab 10/03/22 1420 10/04/22 0543 10/05/22 0507 10/06/22 0513 10/07/22 0536  WBC 5.5 5.7 8.7 9.7 9.7  NEUTROABS 4.5  --   --   --   --    HGB 12.0 10.8* 11.6* 11.0* 11.1*  HCT 34.8* 32.2* 35.0* 32.6* 32.2*  MCV 81.7 83.4 84.3 84.0 81.9  PLT 149* 160 191 236 284   Cardiac Enzymes: No results for input(s): "CKTOTAL", "CKMB", "CKMBINDEX", "TROPONINI" in the last 168 hours. BNP: Invalid input(s): "POCBNP" CBG: Recent Labs  Lab 10/06/22 2111  GLUCAP 110*   D-Dimer No results for input(s): "DDIMER" in the last 72 hours. Hgb A1c No results for input(s): "HGBA1C" in the last 72 hours. Lipid Profile No results for input(s): "CHOL", "HDL", "LDLCALC", "TRIG", "CHOLHDL", "LDLDIRECT" in the last 72 hours. Thyroid function studies Recent Labs    10/05/22 0506  TSH 0.180*   Anemia work up No results for input(s): "VITAMINB12", "FOLATE", "FERRITIN", "TIBC", "IRON", "RETICCTPCT" in the last 72 hours. Urinalysis No results found for: "COLORURINE", "APPEARANCEUR", "LABSPEC", "PHURINE", "GLUCOSEU", "HGBUR", "BILIRUBINUR", "KETONESUR", "PROTEINUR", "UROBILINOGEN", "NITRITE", "LEUKOCYTESUR" Sepsis Labs Recent Labs  Lab 10/04/22 0543 10/05/22 0507 10/06/22 0513 10/07/22 0536  WBC 5.7 8.7 9.7 9.7   Microbiology Recent Results (from the past 240 hour(s))  Resp panel by RT-PCR (RSV, Flu A&B, Covid) Anterior Nasal Swab     Status: Abnormal   Collection Time: 10/03/22 11:52 AM   Specimen: Anterior Nasal Swab  Result Value Ref Range Status   SARS Coronavirus 2 by RT PCR  NEGATIVE NEGATIVE Final    Comment: (NOTE) SARS-CoV-2 target nucleic acids are NOT DETECTED.  The SARS-CoV-2 RNA is generally detectable in upper respiratory specimens during the acute phase of infection. The lowest concentration of SARS-CoV-2 viral copies this assay can detect is 138 copies/mL. A negative result does not preclude SARS-Cov-2 infection and should not be used as the sole basis for treatment or other patient management decisions. A negative result may occur with  improper specimen collection/handling, submission of specimen other than  nasopharyngeal swab, presence of viral mutation(s) within the areas targeted by this assay, and inadequate number of viral copies(<138 copies/mL). A negative result must be combined with clinical observations, patient history, and epidemiological information. The expected result is Negative.  Fact Sheet for Patients:  BloggerCourse.comhttps://www.fda.gov/media/152166/download  Fact Sheet for Healthcare Providers:  SeriousBroker.ithttps://www.fda.gov/media/152162/download  This test is no t yet approved or cleared by the [place] FDA and  has been authorized for detection and/or diagnosis of SARS-CoV-2 by FDA under an Emergency Use Authorization (EUA). This EUA will remain  in effect (meaning this test can be used) for the duration of the COVID-19 declaration under Section 564(b)(1) of the Act, 21 U.S.C.section 360bbb-3(b)(1), unless the authorization is terminated  or revoked sooner.       Influenza A by PCR POSITIVE (A) NEGATIVE Final   Influenza B by PCR NEGATIVE NEGATIVE Final    Comment: (NOTE) The Xpert Xpress SARS-CoV-2/FLU/RSV plus assay is intended as an aid in the diagnosis of influenza from Nasopharyngeal swab specimens and should not be used as a sole basis for treatment. Nasal washings and aspirates are unacceptable for Xpert Xpress SARS-CoV-2/FLU/RSV testing.  Fact Sheet for Patients: BloggerCourse.comhttps://www.fda.gov/media/152166/download  Fact Sheet for Healthcare Providers: SeriousBroker.ithttps://www.fda.gov/media/152162/download  This test is not yet approved or cleared by the [place] FDA and has been authorized for detection and/or diagnosis of SARS-CoV-2 by FDA under an Emergency Use Authorization (EUA). This EUA will remain in effect (meaning this test can be used) for the duration of the COVID-19 declaration under Section 564(b)(1) of the Act, 21 U.S.C. section 360bbb-3(b)(1), unless the authorization is terminated or revoked.     Resp Syncytial Virus by PCR NEGATIVE NEGATIVE Final    Comment:  (NOTE) Fact Sheet for Patients: BloggerCourse.comhttps://www.fda.gov/media/152166/download  Fact Sheet for Healthcare Providers: SeriousBroker.ithttps://www.fda.gov/media/152162/download  This test is not yet approved or cleared by the [place] FDA and has been authorized for detection and/or diagnosis of SARS-CoV-2 by FDA under an Emergency Use Authorization (EUA). This EUA will remain in effect (meaning this test can be used) for the duration of the COVID-19 declaration under Section 564(b)(1) of the Act, 21 U.S.C. section 360bbb-3(b)(1), unless the authorization is terminated or revoked.  Performed at [facility], [address] [place], [place] [address]   Resp panel by RT-PCR (RSV, Flu A&B, Covid) Anterior Nasal Swab     Status: Abnormal   Collection Time: 10/03/22  9:07 PM   Specimen: Anterior Nasal Swab  Result Value Ref Range Status   SARS Coronavirus 2 by RT PCR NEGATIVE NEGATIVE Final    Comment: (NOTE) SARS-CoV-2 target nucleic acids are NOT DETECTED.  The SARS-CoV-2 RNA is generally detectable in upper respiratory specimens during the acute phase of infection. The lowest concentration of SARS-CoV-2 viral copies this assay can detect is 138 copies/mL. A negative result does not preclude SARS-Cov-2 infection and should not be used as the sole basis for treatment or other patient management decisions. A negative result may occur with  improper  specimen collection/handling, submission of specimen other than nasopharyngeal swab, presence of viral mutation(s) within the areas targeted by this assay, and inadequate number of viral copies(<138 copies/mL). A negative result must be combined with clinical observations, patient history, and epidemiological information. The expected result is Negative.  Fact Sheet for Patients:  BloggerCourse.com  Fact Sheet for Healthcare Providers:  SeriousBroker.it  This test is no t yet approved or  cleared by the [place] FDA and  has been authorized for detection and/or diagnosis of SARS-CoV-2 by FDA under an Emergency Use Authorization (EUA). This EUA will remain  in effect (meaning this test can be used) for the duration of the COVID-19 declaration under Section 564(b)(1) of the Act, 21 U.S.C.section 360bbb-3(b)(1), unless the authorization is terminated  or revoked sooner.       Influenza A by PCR POSITIVE (A) NEGATIVE Final   Influenza B by PCR NEGATIVE NEGATIVE Final    Comment: (NOTE) The Xpert Xpress SARS-CoV-2/FLU/RSV plus assay is intended as an aid in the diagnosis of influenza from Nasopharyngeal swab specimens and should not be used as a sole basis for treatment. Nasal washings and aspirates are unacceptable for Xpert Xpress SARS-CoV-2/FLU/RSV testing.  Fact Sheet for Patients: BloggerCourse.com  Fact Sheet for Healthcare Providers: SeriousBroker.it  This test is not yet approved or cleared by the [place] FDA and has been authorized for detection and/or diagnosis of SARS-CoV-2 by FDA under an Emergency Use Authorization (EUA). This EUA will remain in effect (meaning this test can be used) for the duration of the COVID-19 declaration under Section 564(b)(1) of the Act, 21 U.S.C. section 360bbb-3(b)(1), unless the authorization is terminated or revoked.     Resp Syncytial Virus by PCR NEGATIVE NEGATIVE Final    Comment: (NOTE) Fact Sheet for Patients: BloggerCourse.com  Fact Sheet for Healthcare Providers: SeriousBroker.it  This test is not yet approved or cleared by the [place] FDA and has been authorized for detection and/or diagnosis of SARS-CoV-2 by FDA under an Emergency Use Authorization (EUA). This EUA will remain in effect (meaning this test can be used) for the duration of the COVID-19 declaration under Section 564(b)(1) of the Act,  21 U.S.C. section 360bbb-3(b)(1), unless the authorization is terminated or revoked.  Performed at [facility], [address] [place], [place] [address]      Time coordinating discharge:  I have spent 35 minutes face to face with the patient and on the ward discussing the patients care, assessment, plan and disposition with other care givers. >50% of the time was devoted counseling the patient about the risks and benefits of treatment/Discharge disposition and coordinating care.   SIGNED:   Dimple Nanas, MD  [facility] 10/07/2022, 11:28 AM   If 7PM-7AM, please contact night-coverage

## 2022-10-07 NOTE — Consult Note (Signed)
[facility] for transition from Lovenox to apixaban Indication: atrial fibrillation  Patient Measurements: Height: 5\' 5"  (165.1 cm) Weight: 67.6 kg (149 lb) IBW/kg (Calculated) : 57  Labs: Recent Labs    10/05/22 0507 10/06/22 0513 10/07/22 0536  HGB 11.6* 11.0* 11.1*  HCT 35.0* 32.6* 32.2*  PLT 191 236 284  CREATININE 0.43* 0.38* 0.38*     Estimated Creatinine Clearance: 56.4 mL/min (A) (by C-G formula based on SCr of 0.38 mg/dL (L)).   Medical History: Past Medical History:  Diagnosis Date   Anemia    Anxiety    Hyperlipidemia    Hypertension     Medications:  No anticoagulation prior to admission per my chart review  Assessment: Patient is a 72 y/o F with medical history as above who was admitted 1/2 with Influenzal pneumonia / CAP. Admission complicated by new-onset Afib with RVR on 1/4 AM. Pharmacy consulted to transition from Lovenox to apixaban for Afib.  CBC notable for stable, mild anemia. Scr 0.4 - 0.5.   Plan:  --stop Lovenox (last dose 10/07/22 2135) --start apixaban 5 mg po BID (meets 0/3 dose reduction criteria) --CBC / Scr per protocol  Dallie Piles 10/07/2022,8:46 AM

## 2022-11-10 ENCOUNTER — Ambulatory Visit: Payer: Medicare Other | Admitting: Cardiology

## 2022-12-06 ENCOUNTER — Ambulatory Visit: Payer: Medicare Other | Attending: Cardiology | Admitting: Cardiovascular Disease

## 2022-12-06 ENCOUNTER — Encounter: Payer: Self-pay | Admitting: Cardiovascular Disease

## 2022-12-06 VITALS — BP 140/68 | HR 59 | Ht 65.0 in | Wt 151.2 lb

## 2022-12-06 DIAGNOSIS — I480 Paroxysmal atrial fibrillation: Secondary | ICD-10-CM | POA: Diagnosis not present

## 2022-12-06 DIAGNOSIS — J9601 Acute respiratory failure with hypoxia: Secondary | ICD-10-CM

## 2022-12-06 DIAGNOSIS — I700 Atherosclerosis of aorta: Secondary | ICD-10-CM

## 2022-12-06 DIAGNOSIS — I10 Essential (primary) hypertension: Secondary | ICD-10-CM

## 2022-12-06 NOTE — Progress Notes (Signed)
Cardiology Office Note  Date:  12/06/2022   ID:  ASTRAEA GALELLA, DOB 1951-01-29, MRN [id number]  PCP:  Overton Mam, MD   Chief Complaint  Patient presents with   New Patient (Initial Visit)    Patient was at [facility] with the Flu; had an A-fib episode while in the hospital. Medications reviewed by the patient verbally.      HPI:  Ms. Danyetta Rizzuti is a 72 year old woman with past medical history of Hypertension GERD Flu/ pneumonia January 2024 Who presents by referral from [place] for atrial fibrillation   In the hospital January 2024 with discharge October 07, 2022 influenza A positive, COVID/RSV was negative.   Chest x-ray showed bilateral interstitial opacity.  Patient was started on Tamiflu, IV Rocephin and azithromycin.  Hospital course was complicated by atrial fibrillation with RVR requiring IV Lopressor, Cardizem push and Cardizem drip.  Patient spontaneously converted to normal sinus rhythm.     Echocardiogram October 06, 2022 EF 60 to 65%, moderately elevated right heart pressures, mild to moderate MR, moderate to severe TR  In follow-up after hospitalization, getting her strength back, feels back to normal Walks 30 min, 30 min with dogs, does not have to stop for SOB Denies significant tachypalpitations, no PND orthopnea, no lower leg edema Blood pressure well-controlled  CT chest images January 2024 pulled up and reviewed in detail, minimal to mild scattered aortic atherosclerosis  Lab work reviewed Total chol 107, LDL 59  EKG personally reviewed by myself on todays visit Normal sinus rhythm rate 59 bpm no significant ST-T wave changes  PMH:   has a past medical history of Anemia, Anxiety, Hyperlipidemia, and Hypertension.  PSH:    Past Surgical History:  Procedure Laterality Date   ABDOMINAL HYSTERECTOMY     OPEN REDUCTION INTERNAL FIXATION (ORIF) DISTAL RADIAL FRACTURE Left 10/16/2017   Procedure: OPEN REDUCTION INTERNAL FIXATION (ORIF) DISTAL RADIAL FRACTURE;   Surgeon: Hessie Knows, MD;  Location: [facility] ORS;  Service: Orthopedics;  Laterality: Left;    Current Outpatient Medications  Medication Sig Dispense Refill   amLODipine (NORVASC) 5 MG tablet Take 5 mg by mouth daily.     apixaban (ELIQUIS) 5 MG TABS tablet Take 1 tablet (5 mg total) by mouth 2 (two) times daily. 60 tablet 0   atorvastatin (LIPITOR) 10 MG tablet Take 10 mg by mouth daily.     buPROPion (WELLBUTRIN XL) 150 MG 24 hr tablet Take 150 mg by mouth at bedtime.      carvedilol (COREG) 12.5 MG tablet Take 3 tablets (37.5 mg total) by mouth 2 (two) times daily with a meal. 180 tablet 0   Cholecalciferol (VITAMIN D3) 2000 units TABS Take 2,000 Units by mouth daily.      diphenhydrAMINE (BENADRYL) 25 mg capsule Take 25 mg by mouth daily as needed for allergies.      ferrous sulfate 325 (65 FE) MG tablet Take 325 mg by mouth daily with breakfast.     FLUoxetine (PROZAC) 40 MG capsule Take 40 mg by mouth daily.     fluticasone (FLONASE) 50 MCG/ACT nasal spray Place 1 spray into both nostrils at bedtime.      Multiple Vitamins-Minerals (CENTRUM SILVER 50+WOMEN PO) Take 1 tablet by mouth daily.     olmesartan-hydrochlorothiazide (BENICAR HCT) 40-25 MG tablet Take 1 tablet by mouth daily.     omeprazole (PRILOSEC) 40 MG capsule Take 40 mg by mouth daily.     terbinafine (LAMISIL) 250 MG tablet Take 250 mg by  mouth daily.     tretinoin (RETIN-A) 0.025 % cream Apply 1 Application topically at bedtime.     No current facility-administered medications for this visit.     Allergies:   Penicillins   Social History:  The patient  reports that she has never smoked. She has never used smokeless tobacco. She reports current alcohol use of about 2.0 standard drinks of alcohol per week. She reports that she does not use drugs.   Family History:   family history includes Breast cancer (age of onset: 36) in her mother; Diabetes in her father; Heart attack in her father; Hypertension in her father.     Review of Systems: Review of Systems  Constitutional: Negative.   HENT: Negative.    Respiratory: Negative.    Cardiovascular: Negative.   Gastrointestinal: Negative.   Musculoskeletal: Negative.   Neurological: Negative.   Psychiatric/Behavioral: Negative.    All other systems reviewed and are negative.    PHYSICAL EXAM: VS:  BP (!) 140/68 (BP Location: Right Arm, Patient Position: Sitting, Cuff Size: Normal)   Pulse (!) 59   Ht '5\' 5"'$  (1.651 m)   Wt 151 lb 4 oz (68.6 kg)   SpO2 98%   BMI 25.17 kg/m  , BMI Body mass index is 25.17 kg/m. Constitutional:  oriented to person, place, and time. No distress.  HENT:  Head: Grossly normal Eyes:  no discharge. No scleral icterus.  Neck: No JVD, no carotid bruits  Cardiovascular: Regular rate and rhythm, no murmurs appreciated Pulmonary/Chest: Clear to auscultation bilaterally, no wheezes or rails Abdominal: Soft.  no distension.  no tenderness.  Musculoskeletal: Normal range of motion Neurological:  normal muscle tone. Coordination normal. No atrophy Skin: Skin warm and dry Psychiatric: normal affect, pleasant  Recent Labs: 10/04/2022: B Natriuretic Peptide 317.5 10/05/2022: TSH 0.180 10/07/2022: BUN 5; Creatinine, Ser 0.38; Hemoglobin 11.1; Magnesium 1.8; Platelets 284; Potassium 3.0; Sodium 132    Lipid Panel No results found for: "CHOL", "HDL", "LDLCALC", "TRIG"    Wt Readings from Last 3 Encounters:  12/06/22 151 lb 4 oz (68.6 kg)  10/04/22 149 lb (67.6 kg)  10/03/22 149 lb 14.6 oz (68 kg)       ASSESSMENT AND PLAN:  Problem List Items Addressed This Visit     Acute respiratory failure with hypoxia ([place])   Relevant Orders   EKG 12-Lead   Other Visit Diagnoses     Paroxysmal atrial fibrillation (HCC)    -  Primary   Relevant Medications   amLODipine (NORVASC) 5 MG tablet   Other Relevant Orders   EKG 12-Lead   Essential hypertension       Relevant Medications   amLODipine (NORVASC) 5 MG tablet    Aortic atherosclerosis (HCC)       Relevant Medications   amLODipine (NORVASC) 5 MG tablet      Paroxysmal atrial fibrillation Episode of atrial fibrillation in the hospital in the setting of flu and respiratory distress treated with beta-blockers, calcium channel blockers converting to normal sinus rhythm before discharge Was placed on Eliquis 5 twice daily, recommend she continue Continue carvedilol 37.5 twice daily for rate and rhythm control Echocardiogram reviewed, mildly dilated left atrium  Acute respiratory failure with hypoxia In the setting of flu requiring hospitalization in early January 2024, back to her baseline Moderate to severe TR, moderately elevated right heart pressures noted on echo in the setting of respiratory distress At a later date could consider repeat echocardiogram to reevaluate tricuspid valve regurgitation and  right heart pressures Currently feeling back to her baseline, no significant murmur on exam, no significant edema or signs of fluid retention  Essential hypertension Recommended taking the olmesartan HCTZ in the morning, amlodipine in the evening Coreg twice a day no changes made  Aortic atherosclerosis Mild disease noted on CT scan chest, images pulled up and reviewed No further workup needed, cholesterol at goal on Lipitor 10.   Total encounter time more than 60 minutes  Greater than 50% was spent in counseling and coordination of care with the patient    Signed, Esmond Plants, M.D., Ph.D. [place], [place]

## 2022-12-06 NOTE — Patient Instructions (Signed)
Medication Instructions:  Please stop aspirin  If you need a refill on your cardiac medications before your next appointment, please call your pharmacy.   Lab work: No new labs needed  Testing/Procedures: No new testing needed  Follow-Up: At [facility], you and your health needs are our priority.  As part of our continuing mission to provide you with exceptional heart care, we have created designated Provider Care Teams.  These Care Teams include your primary Cardiologist (physician) and Advanced Practice Providers (APPs -  Physician Assistants and Nurse Practitioners) who all work together to provide you with the care you need, when you need it.  You will need a follow up appointment in 12 months  Providers on your designated Care Team:   Murray Hodgkins, NP Christell Faith, PA-C Cadence Kathlen Mody, [place]  COVID-19 Vaccine Information can be found at: ShippingScam.[email or web address].uk For questions related to vaccine distribution or appointments, please email [email or web address] or call [phone].

## 2024-03-04 ENCOUNTER — Ambulatory Visit
Admission: EM | Admit: 2024-03-04 | Discharge: 2024-03-04 | Disposition: A | Discharge status: Home / Self Care | Attending: Physician Assistant | Admitting: Physician Assistant

## 2024-03-04 DIAGNOSIS — R051 Acute cough: Secondary | ICD-10-CM

## 2024-03-04 DIAGNOSIS — I10 Essential (primary) hypertension: Secondary | ICD-10-CM | POA: Diagnosis not present

## 2024-03-04 DIAGNOSIS — J019 Acute sinusitis, unspecified: Secondary | ICD-10-CM | POA: Diagnosis not present

## 2024-03-04 DIAGNOSIS — H1032 Unspecified acute conjunctivitis, left eye: Secondary | ICD-10-CM

## 2024-03-04 MED ORDER — CIPROFLOXACIN HCL 0.3 % OP SOLN: OPHTHALMIC | 0 refills | Status: DC

## 2024-03-04 NOTE — ED Notes (Signed)
 Tried to do visual acuity unable to complete d/t pt stating glasses not UTD & hx of monovision.

## 2024-03-04 NOTE — ED Triage Notes (Signed)
 Pt c/o L eye irritation & drainage since last night. Seen by PCP yesterday & started on doxy x10 days for sinusitis.

## 2024-03-04 NOTE — Discharge Instructions (Addendum)
-   I sent antibiotic eyedrop to the pharmacy in case you are developing a bacterial infection.  Apply cool compresses to the eye and wait to put your contacts back in until after this clears up. - May also use additional eyedrops and decongestants. - Continue doxycycline for sinusitis and over-the-counter cough medicine as needed. -BP 168/75. Taking carvedilol , amlodipine , and olmesartan . Continue taking BP meds and keep log of BP. If consistently >140/90 please follow up with PCP or cardiology about possible medication changes.

## 2024-03-04 NOTE — ED Provider Notes (Signed)
 [facility]    CSN: [id number] Arrival date & time: 03/04/24  0804      History   Chief Complaint Chief Complaint  Patient presents with   Eye Problem    HPI Kimberly Hendrix is a 73 y.o. female presents for onset of left eye redness and yellowish drainage yesterday.  Patient wears contact lenses normally.  States she took the contact lenses out after her eye began draining.  Feels little bit better today.  She is wearing her glasses.  Patient seen by PCP yesterday for >2 week history of cough and congestion. She was diagnosed with acute sinusitis and placed on doxycycline.  Denies fever, chest pain or shortness of breath.  Patient has history of hypertension and takes olmesartan , amlodipine  and carvedilol .  She says she has whitecoat hypertension and is not surprised her blood pressure is elevated at 168/75 today.  HPI  Past Medical History:  Diagnosis Date   Anemia    Anxiety    Hyperlipidemia    Hypertension     Patient Active Problem List   Diagnosis Date Noted   Hypokalemia 10/04/2022   Influenzal pneumonia 10/03/2022   Community acquired pneumonia of left lower lobe of lung 10/03/2022   Hypertensive urgency 10/03/2022   Dyslipidemia 10/03/2022   GERD without esophagitis 10/03/2022   Depression 10/03/2022   Acute respiratory failure with hypoxia (HCC) 10/03/2022    Past Surgical History:  Procedure Laterality Date   ABDOMINAL HYSTERECTOMY     OPEN REDUCTION INTERNAL FIXATION (ORIF) DISTAL RADIAL FRACTURE Left 10/16/2017   Procedure: OPEN REDUCTION INTERNAL FIXATION (ORIF) DISTAL RADIAL FRACTURE;  Surgeon: Molli Angelucci, MD;  Location: [facility] ORS;  Service: Orthopedics;  Laterality: Left;    OB History   No obstetric history on file.      Home Medications    Prior to Admission medications   Medication Sig Start Date End Date Taking? Authorizing Provider  amLODipine  (NORVASC ) 5 MG tablet TAKE 1 TABLET(5 MG) BY MOUTH EVERY DAY 09/27/23  Yes  [provider]  ciprofloxacin (CILOXAN) 0.3 % ophthalmic solution Administer 1 drop to left eye, every 2 hours, while awake, for 2 days. Then 1 drop, every 4 hours, while awake, for the next 5 days. 03/04/24  Yes Nancy Axon B, PA-C  doxycycline (VIBRAMYCIN) 100 MG capsule Take 1 capsule (100 mg total) by mouth 2 (two) times daily for 10 days 03/03/24 03/13/24 Yes [provider]  apixaban  (ELIQUIS ) 5 MG TABS tablet Take 1 tablet (5 mg total) by mouth 2 (two) times daily. 10/07/22 03/04/24 Yes Amin, Ankit C, MD  atorvastatin  (LIPITOR) 10 MG tablet Take 10 mg by mouth daily.   Yes [provider]  buPROPion  (WELLBUTRIN  XL) 150 MG 24 hr tablet Take 150 mg by mouth at bedtime.    Yes [provider]  carvedilol  (COREG ) 12.5 MG tablet Take 3 tablets (37.5 mg total) by mouth 2 (two) times daily with a meal. 10/07/22 03/04/24 Yes Amin, Ankit C, MD  Cholecalciferol  (VITAMIN D3) 2000 units TABS Take 2,000 Units by mouth daily.    Yes [provider]  diphenhydrAMINE  (BENADRYL ) 25 mg capsule Take 25 mg by mouth daily as needed for allergies.    Yes [provider]  ferrous sulfate  325 (65 FE) MG tablet Take 325 mg by mouth daily with breakfast.   Yes [provider]  FLUoxetine  (PROZAC ) 40 MG capsule Take 40 mg by mouth daily.   Yes [provider]  fluticasone  (FLONASE )  50 MCG/ACT nasal spray Place 1 spray into both nostrils at bedtime.    Yes [provider]  Multiple Vitamins-Minerals (CENTRUM SILVER 50+WOMEN PO) Take 1 tablet by mouth daily.   Yes [provider]  olmesartan -hydrochlorothiazide  (BENICAR  HCT) 40-25 MG tablet Take 1 tablet by mouth daily. 08/07/22  Yes [provider]  omeprazole (PRILOSEC) 40 MG capsule Take 40 mg by mouth daily.   Yes [provider]  tretinoin (RETIN-A) 0.025 % cream Apply 1 Application topically at bedtime.   Yes [provider]    Family History Family History   Problem Relation Age of Onset   Breast cancer Mother 47       expired at age 73   Hypertension Father    Heart attack Father    Diabetes Father     Social History Social History   Tobacco Use   Smoking status: Never   Smokeless tobacco: Never  Vaping Use   Vaping status: Never Used  Substance Use Topics   Alcohol use: Yes    Alcohol/week: 2.0 standard drinks of alcohol    Types: 2 Glasses of wine per week   Drug use: No     Allergies   Penicillins   Review of Systems Review of Systems  Constitutional:  Negative for chills, diaphoresis, fatigue and fever.  HENT:  Positive for congestion, rhinorrhea and sinus pressure. Negative for ear pain and sore throat.   Eyes:  Positive for discharge, redness and itching. Negative for photophobia, pain and visual disturbance.  Respiratory:  Positive for cough. Negative for shortness of breath.   Cardiovascular:  Negative for chest pain.  Gastrointestinal:  Positive for diarrhea. Negative for abdominal pain, nausea and vomiting.  Musculoskeletal:  Negative for arthralgias and myalgias.  Skin:  Negative for rash.  Neurological:  Negative for dizziness, weakness and headaches.  Hematological:  Negative for adenopathy.     Physical Exam Triage Vital Signs ED Triage Vitals  Encounter Vitals Group     BP      Systolic BP Percentile      Diastolic BP Percentile      Pulse      Resp      Temp      Temp src      SpO2      Weight      Height      Head Circumference      Peak Flow      Pain Score      Pain Loc      Pain Education      Exclude from Growth Chart    No data found.  Updated Vital Signs BP (!) 168/75 (BP Location: Left Arm)   Pulse (!) 58   Temp 98.6 F (37 C) (Oral)   Resp 16   Ht 5\' 5"  (1.651 m)   Wt 148 lb (67.1 kg)   SpO2 95%   BMI 24.63 kg/m      Physical Exam Vitals and nursing note reviewed.  Constitutional:      General: She is not in acute distress.    Appearance: Normal appearance. She  is not ill-appearing or toxic-appearing.  HENT:     Head: Normocephalic and atraumatic.     Nose: Congestion present.     Mouth/Throat:     Mouth: Mucous membranes are moist.     Pharynx: Oropharynx is clear.  Eyes:     General: No scleral icterus.       Right eye:  No discharge.        Left eye: Discharge present.    Conjunctiva/sclera:     Left eye: Left conjunctiva is injected.     Comments: Mild swelling left upper eyelid  Cardiovascular:     Rate and Rhythm: Normal rate and regular rhythm.     Heart sounds: Normal heart sounds.  Pulmonary:     Effort: Pulmonary effort is normal. No respiratory distress.     Breath sounds: Normal breath sounds.  Musculoskeletal:     Cervical back: Neck supple.  Skin:    General: Skin is dry.  Neurological:     General: No focal deficit present.     Mental Status: She is alert. Mental status is at baseline.     Motor: No weakness.     Gait: Gait normal.  Psychiatric:        Mood and Affect: Mood normal.        Behavior: Behavior normal.      UC Treatments / Results  Labs (all labs ordered are listed, but only abnormal results are displayed) Labs Reviewed - No data to display  EKG   Radiology No results found.  Procedures Procedures (including critical care time)  Medications Ordered in UC Medications - No data to display  Initial Impression / Assessment and Plan / UC Course  I have reviewed the triage vital signs and the nursing notes.  Pertinent labs & imaging results that were available during my care of the patient were reviewed by me and considered in my medical decision making (see chart for details).   73 y/o female presents for left eye irritation and drainage since yesterday.  She wears contact lenses. Seen by PCP yesterday for >2 week history of cough and congestion. Currently on doxycycline (since yesterday) for acute sinusitis. Chart reviewed from PCP visit yesterday.  BP 168/75. Taking carvedilol , amlodipine ,  and olmesartan . Advised to continue taking BP meds and keep log of BP. If consistently >140/90 please follow up with PCP or cardiology about possible medication changes.   On exam has mild swelling of the left upper eyelid and injection of conjunctivitis with drainage from eye, nasal congestion.  Throat clear.  Chest clear.  Suspect acute developing bacterial conjunctivitis.  Treating at this time with Soloxine since she is a contact lens wear.  Advised cool compresses and waiting to put contact lenses back in until after this clears up.  Advise continuing doxycycline for sinusitis and over-the-counter cough medicine, rest and fluids.  Thoroughly reviewed return precautions.   Final Clinical Impressions(s) / UC Diagnoses   Final diagnoses:  Acute conjunctivitis of left eye, unspecified acute conjunctivitis type  Essential hypertension  Acute sinusitis, recurrence not specified, unspecified location  Acute cough     Discharge Instructions      - I sent antibiotic eyedrop to the pharmacy in case you are developing a bacterial infection.  Apply cool compresses to the eye and wait to put your contacts back in until after this clears up. - May also use additional eyedrops and decongestants. - Continue doxycycline for sinusitis and over-the-counter cough medicine as needed. -BP 168/75. Taking carvedilol , amlodipine , and olmesartan . Continue taking BP meds and keep log of BP. If consistently >140/90 please follow up with PCP or cardiology about possible medication changes.    ED Prescriptions     Medication Sig Dispense Auth. Provider   ciprofloxacin (CILOXAN) 0.3 % ophthalmic solution Administer 1 drop to left eye, every 2 hours, while awake, for  2 days. Then 1 drop, every 4 hours, while awake, for the next 5 days. 5 mL Floydene Hy, PA-C      PDMP not reviewed this encounter.   Floydene Hy, PA-C 03/04/24 [phone]

## 2024-04-01 NOTE — Progress Notes (Unsigned)
 Cardiology Office Note  Date:  04/02/2024   ID:  Kimberly Hendrix, DOB [birth year], MRN [id number]  PCP:  Zina Selma ORN, MD   Chief Complaint  Patient presents with   12 month follow up     Patient c/o fluttering in chest at times with mostly at rest.     HPI:  Ms. Kimberly Hendrix is a 73 year old woman with past medical history of Hypertension GERD Flu/ pneumonia January 2024 Who presents for f/u of her atrial fibrillation 1/24, flu  LOV 3/24 In follow-up she reports doing well Started going to gym 3x a week No SOB, no chest pain on exertion  Rare flutter, lasting less than several seconds  Tolerating amlodipine  5 daily, carvedilol  37.5 twice daily, Benicar  HCT 40/25 daily  Prior echocardiogram reviewed from early 2024 detailing moderately elevated right heart pressures, moderate to severe TR No follow-up study since that time  EKG personally reviewed by myself on todays visit EKG Interpretation Date/Time:  Wednesday April 02 2024 10:19:15 EDT Ventricular Rate:  58 PR Interval:  174 QRS Duration:  84 QT Interval:  444 QTC Calculation: 435 R Axis:   -7  Text Interpretation: Sinus bradycardia When compared with ECG of 05-Oct-2022 11:09, PREVIOUS ECG IS PRESENT Confirmed by Perla Lye [phone]) on 04/02/2024 10:37:08 AM   Other past medical history reviewed In the hospital January 2024 with discharge October 07, 2022 influenza A positive, COVID/RSV was negative.   Chest x-ray showed bilateral interstitial opacity.  Patient was started on Tamiflu , IV Rocephin  and azithromycin .  Hospital course was complicated by atrial fibrillation with RVR requiring IV Lopressor , Cardizem  push and Cardizem  drip.  Patient spontaneously converted to normal sinus rhythm.     CT chest images January 2024   minimal to mild scattered aortic atherosclerosis  Lab work reviewed Total chol 107, LDL 59  EKG personally reviewed by myself on todays visit EKG Interpretation Date/Time:  Wednesday April 02 2024 10:19:15 EDT Ventricular Rate:  58 PR Interval:  174 QRS Duration:  84 QT Interval:  444 QTC Calculation: 435 R Axis:   -7  Text Interpretation: Sinus bradycardia When compared with ECG of 05-Oct-2022 11:09, PREVIOUS ECG IS PRESENT Confirmed by Perla Lye [phone]) on 04/02/2024 10:37:08 AM   PMH:   has a past medical history of Anemia, Anxiety, Hyperlipidemia, and Hypertension.  PSH:    Past Surgical History:  Procedure Laterality Date   ABDOMINAL HYSTERECTOMY     OPEN REDUCTION INTERNAL FIXATION (ORIF) DISTAL RADIAL FRACTURE Left 10/16/2017   Procedure: OPEN REDUCTION INTERNAL FIXATION (ORIF) DISTAL RADIAL FRACTURE;  Surgeon: Kathlynn Sharper, MD;  Location: [facility] ORS;  Service: Orthopedics;  Laterality: Left;    Current Outpatient Medications  Medication Sig Dispense Refill   amLODipine  (NORVASC ) 5 MG tablet TAKE 1 TABLET(5 MG) BY MOUTH EVERY DAY     apixaban  (ELIQUIS ) 5 MG TABS tablet Take 1 tablet (5 mg total) by mouth 2 (two) times daily. 60 tablet 0   atorvastatin  (LIPITOR) 10 MG tablet Take 10 mg by mouth daily.     buPROPion  (WELLBUTRIN  XL) 150 MG 24 hr tablet Take 150 mg by mouth at bedtime.      carvedilol  (COREG ) 12.5 MG tablet Take 3 tablets (37.5 mg total) by mouth 2 (two) times daily with a meal. 180 tablet 0   Cholecalciferol  (VITAMIN D3) 2000 units TABS Take 2,000 Units by mouth daily.      diphenhydrAMINE  (BENADRYL ) 25 mg capsule Take 25 mg by mouth daily as needed  for allergies.      ferrous sulfate  325 (65 FE) MG tablet Take 325 mg by mouth daily with breakfast.     FLUoxetine  (PROZAC ) 40 MG capsule Take 40 mg by mouth daily.     fluticasone  (FLONASE ) 50 MCG/ACT nasal spray Place 1 spray into both nostrils at bedtime.      Multiple Vitamins-Minerals (CENTRUM SILVER 50+WOMEN PO) Take 1 tablet by mouth daily.     olmesartan -hydrochlorothiazide  (BENICAR  HCT) 40-25 MG tablet Take 1 tablet by mouth daily.     omeprazole (PRILOSEC) 40 MG capsule Take 40 mg by mouth  daily.     tretinoin (RETIN-A) 0.025 % cream Apply 1 Application topically at bedtime.     No current facility-administered medications for this visit.     Allergies:   Penicillins   Social History:  The patient  reports that she has never smoked. She has never used smokeless tobacco. She reports current alcohol use of about 2.0 standard drinks of alcohol per week. She reports that she does not use drugs.   Family History:   family history includes Breast cancer (age of onset: 94) in her mother; Diabetes in her father; Heart attack in her father; Hypertension in her father.    Review of Systems: Review of Systems  Constitutional: Negative.   HENT: Negative.    Respiratory: Negative.    Cardiovascular: Negative.   Gastrointestinal: Negative.   Musculoskeletal: Negative.   Neurological: Negative.   Psychiatric/Behavioral: Negative.    All other systems reviewed and are negative.    PHYSICAL EXAM: VS:  BP 128/60 (BP Location: Left Arm, Patient Position: Sitting, Cuff Size: Normal)   Pulse (!) 58   Ht 5' 6 (1.676 m)   Wt 154 lb (69.9 kg)   SpO2 97%   BMI 24.86 kg/m  , BMI Body mass index is 24.86 kg/m. Constitutional:  oriented to person, place, and time. No distress.  HENT:  Head: Grossly normal Eyes:  no discharge. No scleral icterus.  Neck: No JVD, no carotid bruits  Cardiovascular: Regular rate and rhythm, no murmurs appreciated Pulmonary/Chest: Clear to auscultation bilaterally, no wheezes or rales Abdominal: Soft.  no distension.  no tenderness.  Musculoskeletal: Normal range of motion Neurological:  normal muscle tone. Coordination normal. No atrophy Skin: Skin warm and dry Psychiatric: normal affect, pleasant  Recent Labs: No results found for requested labs within last 365 days.    Lipid Panel No results found for: CHOL, HDL, LDLCALC, TRIG    Wt Readings from Last 3 Encounters:  04/02/24 154 lb (69.9 kg)  03/04/24 148 lb (67.1 kg)  12/06/22  151 lb 4 oz (68.6 kg)       ASSESSMENT AND PLAN:  Problem List Items Addressed This Visit       Cardiology Problems   Hypertensive urgency     Other   Dyslipidemia   Acute respiratory failure with hypoxia (HCC)   Relevant Orders   EKG 12-Lead (Completed)   Other Visit Diagnoses       Paroxysmal atrial fibrillation (HCC)    -  Primary   Relevant Orders   EKG 12-Lead (Completed)     Essential hypertension       Relevant Orders   EKG 12-Lead (Completed)     Aortic atherosclerosis (HCC)       Relevant Orders   EKG 12-Lead (Completed)     Pulmonary hypertension, unspecified (HCC)       Relevant Orders   ECHOCARDIOGRAM COMPLETE  Paroxysmal atrial fibrillation Episode of atrial fibrillation in the hospital in the setting of flu and respiratory distress 1/24  treated with beta-blockers, calcium  channel blockers converting to normal sinus rhythm before discharge on Eliquis  5 twice daily,  Recommend she continue carvedilol  37.5 twice daily for rate and rhythm control  Pulmonary hypertension, tricuspid valve regurgitation Echocardiogram ordered to f/u on prior findings on echo 2024  Acute respiratory failure with hypoxia In the setting of flu requiring hospitalization in early January 2024,  Has remained at her baseline Echo ordered to reevaluate tricuspid valve regurgitation and right heart pressures  Essential hypertension Blood pressure is well controlled on today's visit. No changes made to the medications.  Aortic atherosclerosis Mild disease noted on CT scan chest,  cholesterol at goal (109) on Lipitor 10.   Signed, Velinda Lunger, M.D., Ph.D. [facility] [place], [place] [phone]

## 2024-04-02 ENCOUNTER — Ambulatory Visit: Attending: Cardiovascular Disease | Admitting: Cardiovascular Disease

## 2024-04-02 ENCOUNTER — Encounter: Payer: Self-pay | Admitting: Cardiovascular Disease

## 2024-04-02 VITALS — BP 128/60 | HR 58 | Ht 66.0 in | Wt 154.0 lb

## 2024-04-02 DIAGNOSIS — I10 Essential (primary) hypertension: Secondary | ICD-10-CM

## 2024-04-02 DIAGNOSIS — I272 Pulmonary hypertension, unspecified: Secondary | ICD-10-CM

## 2024-04-02 DIAGNOSIS — I480 Paroxysmal atrial fibrillation: Secondary | ICD-10-CM

## 2024-04-02 DIAGNOSIS — E785 Hyperlipidemia, unspecified: Secondary | ICD-10-CM

## 2024-04-02 DIAGNOSIS — I700 Atherosclerosis of aorta: Secondary | ICD-10-CM

## 2024-04-02 DIAGNOSIS — J9601 Acute respiratory failure with hypoxia: Secondary | ICD-10-CM

## 2024-04-02 DIAGNOSIS — I160 Hypertensive urgency: Secondary | ICD-10-CM

## 2024-04-02 NOTE — Patient Instructions (Addendum)
 Medication Instructions:  No changes  If you need a refill on your cardiac medications before your next appointment, please call your pharmacy.   Lab work: No new labs needed  Testing/Procedures: Your physician has requested that you have an echocardiogram. Echocardiography is a painless test that uses sound waves to create images of your heart. It provides your doctor with information about the size and shape of your heart and how well your heart's chambers and valves are working.   You may receive an ultrasound enhancing agent through an IV if needed to better visualize your heart during the echo. This procedure takes approximately one hour.  There are no restrictions for this procedure.  This will take place at [address] [facility] [address], [place] [address]  Please note: We ask at that you not bring children with you during ultrasound (echo/ vascular) testing. Due to room size and safety concerns, children are not allowed in the ultrasound rooms during exams. Our front office staff cannot provide observation of children in our lobby area while testing is being conducted. An adult accompanying a patient to their appointment will only be allowed in the ultrasound room at the discretion of the ultrasound technician under special circumstances. We apologize for any inconvenience.   Follow-Up: At [facility], you and your health needs are our priority.  As part of our continuing mission to provide you with exceptional heart care, we have created designated Provider Care Teams.  These Care Teams include your primary Cardiologist (physician) and Advanced Practice Providers (APPs -  Physician Assistants and Nurse Practitioners) who all work together to provide you with the care you need, when you need it.  You will need a follow up appointment in 12 months  Providers on your designated Care Team:   Nicolasa Ducking, NP Eula Listen, PA-C Cadence Fransico Michael, [place]  COVID-19  Vaccine Information can be found at: [email or web address] For questions related to vaccine distribution or appointments, please email [email or web address] or call [phone].

## 2024-04-22 ENCOUNTER — Ambulatory Visit: Attending: Cardiovascular Disease

## 2024-04-22 DIAGNOSIS — I272 Pulmonary hypertension, unspecified: Secondary | ICD-10-CM

## 2024-04-22 LAB — ECHOCARDIOGRAM COMPLETE
AR max vel: 2.17 cm2
AV Area VTI: 2.12 cm2
AV Area mean vel: 1.99 cm2
AV Mean grad: 2 mmHg
AV Peak grad: 4.2 mmHg
Ao pk vel: 1.03 m/s
Area-P 1/2: 2.95 cm2
S' Lateral: 3.04 cm

## 2024-04-26 ENCOUNTER — Ambulatory Visit: Payer: Self-pay | Admitting: Cardiovascular Disease
